# Patient Record
Sex: Male | Born: 1935 | Race: White | Hispanic: No | Marital: Single | Smoking: Former smoker
Health system: Southern US, Community
[De-identification: ages and names within clinical notes are randomized; demographics above are authoritative.]

## PROBLEM LIST (undated history)

## (undated) DIAGNOSIS — Z66 Do not resuscitate: Secondary | ICD-10-CM

## (undated) DIAGNOSIS — Z85828 Personal history of other malignant neoplasm of skin: Secondary | ICD-10-CM

## (undated) DIAGNOSIS — I255 Ischemic cardiomyopathy: Secondary | ICD-10-CM

## (undated) DIAGNOSIS — Z8719 Personal history of other diseases of the digestive system: Secondary | ICD-10-CM

## (undated) DIAGNOSIS — E119 Type 2 diabetes mellitus without complications: Secondary | ICD-10-CM

## (undated) DIAGNOSIS — I951 Orthostatic hypotension: Secondary | ICD-10-CM

## (undated) DIAGNOSIS — N179 Acute kidney failure, unspecified: Secondary | ICD-10-CM

## (undated) DIAGNOSIS — Z8711 Personal history of peptic ulcer disease: Secondary | ICD-10-CM

## (undated) DIAGNOSIS — I10 Essential (primary) hypertension: Secondary | ICD-10-CM

## (undated) DIAGNOSIS — I350 Nonrheumatic aortic (valve) stenosis: Secondary | ICD-10-CM

## (undated) DIAGNOSIS — I214 Non-ST elevation (NSTEMI) myocardial infarction: Secondary | ICD-10-CM

## (undated) DIAGNOSIS — D649 Anemia, unspecified: Secondary | ICD-10-CM

## (undated) DIAGNOSIS — I2510 Atherosclerotic heart disease of native coronary artery without angina pectoris: Secondary | ICD-10-CM

## (undated) DIAGNOSIS — I4821 Permanent atrial fibrillation: Secondary | ICD-10-CM

## (undated) HISTORY — DX: Type 2 diabetes mellitus without complications: E11.9

## (undated) HISTORY — DX: Personal history of other diseases of the digestive system: Z87.19

## (undated) HISTORY — DX: Non-ST elevation (NSTEMI) myocardial infarction: I21.4

## (undated) HISTORY — DX: Essential (primary) hypertension: I10

## (undated) HISTORY — DX: Personal history of other malignant neoplasm of skin: Z85.828

## (undated) HISTORY — DX: Atherosclerotic heart disease of native coronary artery without angina pectoris: I25.10

## (undated) HISTORY — DX: Permanent atrial fibrillation: I48.21

## (undated) HISTORY — DX: Ischemic cardiomyopathy: I25.5

## (undated) HISTORY — PX: ABDOMINAL SURGERY: SHX537

## (undated) HISTORY — DX: Personal history of peptic ulcer disease: Z87.11

## (undated) HISTORY — DX: Nonrheumatic aortic (valve) stenosis: I35.0

---

## 1997-08-17 ENCOUNTER — Encounter: Admission: RE | Admit: 1997-08-17 | Discharge: 1997-11-15 | Payer: Self-pay | Admitting: Internal Medicine

## 2000-07-10 ENCOUNTER — Ambulatory Visit (HOSPITAL_COMMUNITY): Admission: RE | Admit: 2000-07-10 | Discharge: 2000-07-10 | Payer: Self-pay | Admitting: Ophthalmology

## 2004-03-28 ENCOUNTER — Ambulatory Visit (HOSPITAL_COMMUNITY): Admission: RE | Admit: 2004-03-28 | Discharge: 2004-03-28 | Payer: Self-pay | Admitting: *Deleted

## 2008-12-23 ENCOUNTER — Ambulatory Visit (HOSPITAL_COMMUNITY): Admission: RE | Admit: 2008-12-23 | Discharge: 2008-12-23 | Payer: Self-pay | Admitting: General Surgery

## 2008-12-23 ENCOUNTER — Encounter (INDEPENDENT_AMBULATORY_CARE_PROVIDER_SITE_OTHER): Payer: Self-pay | Admitting: General Surgery

## 2009-03-11 ENCOUNTER — Observation Stay (HOSPITAL_COMMUNITY): Admission: EM | Admit: 2009-03-11 | Discharge: 2009-03-11 | Payer: Self-pay | Admitting: Emergency Medicine

## 2009-11-07 ENCOUNTER — Ambulatory Visit: Payer: Self-pay | Admitting: Cardiology

## 2010-03-06 HISTORY — PX: COLONOSCOPY: SHX174

## 2010-05-22 LAB — CBC
HCT: 39.5 % (ref 39.0–52.0)
Hemoglobin: 13.6 g/dL (ref 13.0–17.0)
MCV: 89.9 fL (ref 78.0–100.0)
Platelets: 245 10*3/uL (ref 150–400)
RDW: 14 % (ref 11.5–15.5)

## 2010-05-22 LAB — COMPREHENSIVE METABOLIC PANEL
AST: 22 U/L (ref 0–37)
Albumin: 4.1 g/dL (ref 3.5–5.2)
Alkaline Phosphatase: 55 U/L (ref 39–117)
CO2: 21 mEq/L (ref 19–32)
Chloride: 101 mEq/L (ref 96–112)
Creatinine, Ser: 1.15 mg/dL (ref 0.4–1.5)
GFR calc Af Amer: >60 mL/min (ref >60)
GFR calc non Af Amer: >60 mL/min (ref >60)
Potassium: 4.1 mEq/L (ref 3.5–5.1)
Total Bilirubin: 0.3 mg/dL (ref 0.3–1.2)

## 2010-05-22 LAB — CK TOTAL AND CKMB (NOT AT ARMC)
CK, MB: 1.2 ng/mL (ref 0.3–4.0)
CK, MB: 1.6 ng/mL (ref 0.3–4.0)
Relative Index: INVALID (ref 0.0–2.5)
Total CK: 70 U/L (ref 7–232)

## 2010-05-22 LAB — RAPID URINE DRUG SCREEN, HOSP PERFORMED
Barbiturates: NOT DETECTED
Opiates: NOT DETECTED

## 2010-05-22 LAB — TROPONIN I: Troponin I: 0.06 ng/mL (ref 0.00–0.06)

## 2010-06-09 LAB — GLUCOSE, CAPILLARY

## 2010-06-09 LAB — BASIC METABOLIC PANEL
BUN: 23 mg/dL (ref 6–23)
CO2: 29 mEq/L (ref 19–32)
Chloride: 101 mEq/L (ref 96–112)
GFR calc non Af Amer: 57 mL/min — ABNORMAL LOW (ref >60)
Glucose, Bld: 154 mg/dL — ABNORMAL HIGH (ref 70–99)
Potassium: 4 mEq/L (ref 3.5–5.1)
Sodium: 137 mEq/L (ref 135–145)

## 2010-10-08 ENCOUNTER — Inpatient Hospital Stay (HOSPITAL_COMMUNITY)
Admission: EM | Admit: 2010-10-08 | Discharge: 2010-10-17 | DRG: 377 | Disposition: A | Discharge status: Home Health | Payer: Medicare Other | Source: Other Acute Inpatient Hospital | Attending: Internal Medicine | Admitting: Internal Medicine

## 2010-10-08 DIAGNOSIS — K5731 Diverticulosis of large intestine without perforation or abscess with bleeding: Principal | ICD-10-CM | POA: Diagnosis present

## 2010-10-08 DIAGNOSIS — I4891 Unspecified atrial fibrillation: Secondary | ICD-10-CM | POA: Diagnosis present

## 2010-10-08 DIAGNOSIS — I2510 Atherosclerotic heart disease of native coronary artery without angina pectoris: Secondary | ICD-10-CM | POA: Diagnosis present

## 2010-10-08 DIAGNOSIS — D62 Acute posthemorrhagic anemia: Secondary | ICD-10-CM | POA: Diagnosis present

## 2010-10-08 DIAGNOSIS — E119 Type 2 diabetes mellitus without complications: Secondary | ICD-10-CM | POA: Diagnosis present

## 2010-10-08 DIAGNOSIS — I252 Old myocardial infarction: Secondary | ICD-10-CM

## 2010-10-08 DIAGNOSIS — I10 Essential (primary) hypertension: Secondary | ICD-10-CM | POA: Diagnosis present

## 2010-10-08 DIAGNOSIS — J189 Pneumonia, unspecified organism: Secondary | ICD-10-CM | POA: Diagnosis present

## 2010-10-08 DIAGNOSIS — R404 Transient alteration of awareness: Secondary | ICD-10-CM | POA: Diagnosis not present

## 2010-10-08 DIAGNOSIS — I214 Non-ST elevation (NSTEMI) myocardial infarction: Secondary | ICD-10-CM | POA: Diagnosis present

## 2010-10-08 DIAGNOSIS — E538 Deficiency of other specified B group vitamins: Secondary | ICD-10-CM | POA: Diagnosis present

## 2010-10-08 LAB — HEMOGLOBIN AND HEMATOCRIT, BLOOD
HCT: 26.1 % — ABNORMAL LOW (ref 39.0–52.0)
Hemoglobin: 9.1 g/dL — ABNORMAL LOW (ref 13.0–17.0)

## 2010-10-08 LAB — BASIC METABOLIC PANEL
Calcium: 7.6 mg/dL — ABNORMAL LOW (ref 8.4–10.5)
GFR calc Af Amer: >60 mL/min (ref >60)
GFR calc non Af Amer: >60 mL/min (ref >60)
Glucose, Bld: 225 mg/dL — ABNORMAL HIGH (ref 70–99)
Sodium: 138 mEq/L (ref 135–145)

## 2010-10-08 LAB — GLUCOSE, CAPILLARY: Glucose-Capillary: 189 mg/dL — ABNORMAL HIGH (ref 70–99)

## 2010-10-08 LAB — CBC
MCH: 31.1 pg (ref 26.0–34.0)
Platelets: 179 10*3/uL (ref 150–400)
RBC: 2.93 MIL/uL — ABNORMAL LOW (ref 4.22–5.81)
WBC: 9.8 10*3/uL (ref 4.0–10.5)

## 2010-10-08 LAB — PROTIME-INR
INR: 1.16 (ref 0.00–1.49)
Prothrombin Time: 15 seconds (ref 11.6–15.2)

## 2010-10-09 ENCOUNTER — Inpatient Hospital Stay (HOSPITAL_COMMUNITY): Payer: Medicare Other

## 2010-10-09 LAB — CARDIAC PANEL(CRET KIN+CKTOT+MB+TROPI)
CK, MB: 6.1 ng/mL (ref 0.3–4.0)
Relative Index: 5.7 — ABNORMAL HIGH (ref 0.0–2.5)
Total CK: 410 U/L — ABNORMAL HIGH (ref 7–232)
Troponin I: 0.81 ng/mL (ref <0.30)
Troponin I: 4.68 ng/mL (ref <0.30)
Troponin I: 10.46 ng/mL (ref <0.30)

## 2010-10-09 LAB — GLUCOSE, CAPILLARY: Glucose-Capillary: 205 mg/dL — ABNORMAL HIGH (ref 70–99)

## 2010-10-09 LAB — ABO/RH: ABO/RH(D): A POS

## 2010-10-09 LAB — HEMOGLOBIN AND HEMATOCRIT, BLOOD
HCT: 23.8 % — ABNORMAL LOW (ref 39.0–52.0)
HCT: 24.9 % — ABNORMAL LOW (ref 39.0–52.0)

## 2010-10-10 ENCOUNTER — Inpatient Hospital Stay (HOSPITAL_COMMUNITY): Payer: Medicare Other

## 2010-10-10 LAB — HEMOGLOBIN AND HEMATOCRIT, BLOOD
HCT: 26.9 % — ABNORMAL LOW (ref 39.0–52.0)
HCT: 27.2 % — ABNORMAL LOW (ref 39.0–52.0)
Hemoglobin: 9.2 g/dL — ABNORMAL LOW (ref 13.0–17.0)
Hemoglobin: 9.6 g/dL — ABNORMAL LOW (ref 13.0–17.0)

## 2010-10-10 LAB — BASIC METABOLIC PANEL
BUN: 13 mg/dL (ref 6–23)
CO2: 27 mEq/L (ref 19–32)
Chloride: 102 mEq/L (ref 96–112)
Glucose, Bld: 202 mg/dL — ABNORMAL HIGH (ref 70–99)
Potassium: 3.7 mEq/L (ref 3.5–5.1)
Sodium: 136 mEq/L (ref 135–145)

## 2010-10-10 LAB — GLUCOSE, CAPILLARY
Glucose-Capillary: 206 mg/dL — ABNORMAL HIGH (ref 70–99)
Glucose-Capillary: 238 mg/dL — ABNORMAL HIGH (ref 70–99)

## 2010-10-10 LAB — HEMOGLOBIN A1C
Hgb A1c MFr Bld: 6.6 % — ABNORMAL HIGH (ref <5.7)
Mean Plasma Glucose: 143 mg/dL — ABNORMAL HIGH (ref <117)

## 2010-10-10 MED ORDER — TECHNETIUM TC 99M-LABELED RED BLOOD CELLS IV KIT
25.0000 | PACK | Freq: Once | INTRAVENOUS | Status: AC | PRN
  Administered 2010-10-10: 25 via INTRAVENOUS

## 2010-10-11 LAB — CBC
Hemoglobin: 9.5 g/dL — ABNORMAL LOW (ref 13.0–17.0)
MCH: 30.6 pg (ref 26.0–34.0)
MCV: 89.7 fL (ref 78.0–100.0)
RBC: 3.1 MIL/uL — ABNORMAL LOW (ref 4.22–5.81)
WBC: 8.8 10*3/uL (ref 4.0–10.5)

## 2010-10-11 LAB — HEMOGLOBIN AND HEMATOCRIT, BLOOD
HCT: 27.4 % — ABNORMAL LOW (ref 39.0–52.0)
Hemoglobin: 9.2 g/dL — ABNORMAL LOW (ref 13.0–17.0)

## 2010-10-11 LAB — GLUCOSE, CAPILLARY

## 2010-10-11 LAB — BASIC METABOLIC PANEL
BUN: 9 mg/dL (ref 6–23)
CO2: 30 mEq/L (ref 19–32)
Chloride: 102 mEq/L (ref 96–112)
Glucose, Bld: 208 mg/dL — ABNORMAL HIGH (ref 70–99)
Potassium: 3.8 mEq/L (ref 3.5–5.1)

## 2010-10-11 NOTE — H&P (Signed)
NAMEJACARIUS, [name] NO.:  [id number]  MEDICAL RECORD NO.:  [id number]  LOCATION:  2103                         FACILITY:  [facility]  PHYSICIAN:  Tarry Kos, MD       DATE OF BIRTH:  [birth year]  DATE OF ADMISSION:  10/08/2010 DATE OF DISCHARGE:                             HISTORY & PHYSICAL   CHIEF COMPLAINT:  Bright red blood per rectum, transferred here from [name].  HISTORY OF PRESENT ILLNESS:  [name] is a 77 year old male with a history of diverticular bleeds in the past and non-STEMI in the last year who refused intervention at that time who has been medically managed only, who presented to the [name] on October 06, 2010, with a GI bleed.  He has received 5 units of blood total there.  He was hypotensive this morning with systolics around 80.  His current systolics are above 100.  He had a bowel prep last night and had a colonoscopy by Dr. Conard Novak, which showed severe diverticulosis throughout the colon with a bleeding diverticulum of the proximal transverse colon, treated with 2 hemoclips.  They subsequently tried to get a Surgery consult, however, he refused to see that surgeon that was on-call there as they only have 1 surgeon available, so at that time, he was transferred here in case he needed surgical intervention because he was refusing to see the surgeon at [name].  The patient states that he has not had any further bleeding at all since last night.  He denies any pain.  He denies any nausea or vomiting.  He is a difficult historian. He says that he initially said he was taking aspirin and Plavix at home, however, then he said he only takes aspirin, however, neither the one of these on his home medication list per his H and P, but again he does say he takes aspirin, but I am not really sure how reliable he is.  He denies any fever, no chills.  REVIEW OF SYSTEMS:  Negative.  HOME MEDICATIONS:  Glipizide, lisinopril, Imdur, metformin,  Novolin 70/30, atenolol, chlorthalidone.  ALLERGIES:  None.  PAST MEDICAL HISTORY:  Diabetes, hypertension.  He had a non-STEMI in September 2011.  At that time, he refused stress testing and so he did not have a heart catheterization.  Ectopic atrial rhythm, status post abdominal surgery in 1994, presumed from gastric ulcer surgery, skin cancer removed from the left ear.  SOCIAL HISTORY:  Denies smoking or alcohol.  No IV drug abuse.  PHYSICAL EXAMINATION:  CURRENT VITAL SIGNS:  Temperature is 98.5, blood pressure systolics are over 100, heart rate is 95-108, O2 sats 99% on room air, respiratory rate normal. GENERAL:  He is alert and oriented x4, in no apparent stress, cooperating friendly. HEENT:  Extraocular muscles intact.  Pupils equal and reactive to light. Oropharynx clear.  Mucous membranes moist. NECK:  No JVD.  No carotid bruits. CORONARY:  Regular rate and rhythm without murmurs or gallops. CHEST:  Clear to auscultation bilaterally.  No rhonchi, wheeze, or rales. ABDOMEN:  Soft, nontender, nondistended.  Positive bowel sounds.  No hepatosplenomegaly. EXTREMITIES:  No clubbing, cyanosis, or edema. PSYCHIATRIC:  Normal affect. NEUROLOGIC:  No focal neurologic deficits.  LABS:  Reviewed from a [name].  His hemoglobin yesterday was 7.9 with a MCV of 89.8, platelet counts were normal.  He had a repeat hemoglobin today at 9:35 a.m. and it was 9.8.  His BUN and creatinine this morning was 35 and 1.42 with a normal sodium, potassium is 4.2.  His coags were normal at [name].  ASSESSMENT AND PLAN:  This is a 77 year old male with diverticular bleed, acute blood loss anemia.  1. Lower gastrointestinal bleed secondary to diverticular bleed with     acute blood loss anemia.  He is status post 5 units of packed red     blood cells.  I will send off for a stat hemoglobin right now to     check his H and H and provide further support if needed, however,     he currently  seems to have stopped bleeding.  If his bleeding     recurs or he become hemodynamically unstable, we will proceed with     surgical consult.  We will call [name] on-call who is Dr. Dulce Sellar     who was already called by [name] who is aware of the patient     coming here.  We will let him know the patient has __________.  We     will proceed with GI consultation and we will hold off on surgery     involvement unless he rebleeds. 2. History of a non-ST segment elevation myocardial infarction with in     complete workup due to patient's refusal.  I am assuming he has     been on aspirin, so obviously hold all aspirin products, this will     need to be resumed in 1-2 weeks, however, he is certainly high risk     for further gastrointestinal bleed issues. 3. Presumptive coronary artery disease, again as above. 4. Diabetes.  He is n.p.o. right now.  We will watch his sugars     closely.  Provide sliding scale insulin as needed. 5. The patient is full code.  Further recommendation depending on     overall [name] course.          ______________________________ Tarry Kos, MD     RD/MEDQ  D:  10/08/2010  T:  10/08/2010  Job:  [id number]  Electronically Signed by Tarry Kos MD on 10/11/2010 09:58:40 PM

## 2010-10-12 LAB — HEMOGLOBIN AND HEMATOCRIT, BLOOD
HCT: 23.5 % — ABNORMAL LOW (ref 39.0–52.0)
HCT: 23.2 % — ABNORMAL LOW (ref 39.0–52.0)
Hemoglobin: 8.3 g/dL — ABNORMAL LOW (ref 13.0–17.0)

## 2010-10-12 LAB — GLUCOSE, CAPILLARY: Glucose-Capillary: 250 mg/dL — ABNORMAL HIGH (ref 70–99)

## 2010-10-13 LAB — VITAMIN B12: Vitamin B-12: 85 pg/mL — ABNORMAL LOW (ref 211–911)

## 2010-10-13 LAB — CROSSMATCH
Unit division: 0
Unit division: 0

## 2010-10-13 LAB — GLUCOSE, CAPILLARY
Glucose-Capillary: 279 mg/dL — ABNORMAL HIGH (ref 70–99)
Glucose-Capillary: 292 mg/dL — ABNORMAL HIGH (ref 70–99)

## 2010-10-13 LAB — CBC
MCH: 30.5 pg (ref 26.0–34.0)
MCHC: 34.1 g/dL (ref 30.0–36.0)
RBC: 2.36 MIL/uL — ABNORMAL LOW (ref 4.22–5.81)
RDW: 14.5 % (ref 11.5–15.5)
WBC: 6.6 10*3/uL (ref 4.0–10.5)

## 2010-10-13 LAB — FOLATE: Folate: 18.1 ng/mL

## 2010-10-13 LAB — BASIC METABOLIC PANEL
BUN: 15 mg/dL (ref 6–23)
Calcium: 9.4 mg/dL (ref 8.4–10.5)
GFR calc Af Amer: >60 mL/min (ref >60)
GFR calc non Af Amer: >60 mL/min (ref >60)
Glucose, Bld: 220 mg/dL — ABNORMAL HIGH (ref 70–99)
Potassium: 4 mEq/L (ref 3.5–5.1)

## 2010-10-13 LAB — HEMOGLOBIN AND HEMATOCRIT, BLOOD
HCT: 23.3 % — ABNORMAL LOW (ref 39.0–52.0)
HCT: 26.3 % — ABNORMAL LOW (ref 39.0–52.0)
Hemoglobin: 7.8 g/dL — ABNORMAL LOW (ref 13.0–17.0)
Hemoglobin: 9 g/dL — ABNORMAL LOW (ref 13.0–17.0)

## 2010-10-13 LAB — FERRITIN: Ferritin: 54 ng/mL (ref 22–322)

## 2010-10-14 LAB — BASIC METABOLIC PANEL
BUN: 13 mg/dL (ref 6–23)
GFR calc Af Amer: >60 mL/min (ref >60)
GFR calc non Af Amer: >60 mL/min (ref >60)
Potassium: 4 mEq/L (ref 3.5–5.1)
Sodium: 136 mEq/L (ref 135–145)

## 2010-10-14 LAB — CROSSMATCH: ABO/RH(D): A POS

## 2010-10-14 LAB — HEMOGLOBIN AND HEMATOCRIT, BLOOD
HCT: 26 % — ABNORMAL LOW (ref 39.0–52.0)
HCT: 26.2 % — ABNORMAL LOW (ref 39.0–52.0)
Hemoglobin: 8.8 g/dL — ABNORMAL LOW (ref 13.0–17.0)

## 2010-10-14 LAB — CBC
HCT: 26.2 % — ABNORMAL LOW (ref 39.0–52.0)
MCHC: 32.8 g/dL (ref 30.0–36.0)
Platelets: 253 10*3/uL (ref 150–400)
RDW: 15.2 % (ref 11.5–15.5)

## 2010-10-14 LAB — GLUCOSE, CAPILLARY: Glucose-Capillary: 287 mg/dL — ABNORMAL HIGH (ref 70–99)

## 2010-10-14 NOTE — Consult Note (Signed)
NAMEGARLAN, DREWES NO.:  [id number]  MEDICAL RECORD NO.:  [id number]  LOCATION:  2103                         FACILITY:  [facility]  PHYSICIAN:  Willis Modena, MD     DATE OF BIRTH:  [birth year]  DATE OF CONSULTATION:  10/09/2010 DATE OF DISCHARGE:                                CONSULTATION   REASON FOR CONSULTATION:  Hematochezia.  CHIEF COMPLAINT:  Weakness.  HISTORY OF PRESENT ILLNESS:  Mr. Heffern is a 77 year old gentleman who presented to [facility] with complaints of GI bleed.  This was described as some bright red blood per rectum and had been ongoing for a few days.  Once he arrived at [facility], he continued to bleed and ultimately underwent a colonoscopy.  This showed a pan diverticulosis with a bleeding diverticula noted in the proximal transverse colon that was treated with hemoclips.  Apparently, he improved but still had some further bleeding and Surgery at that facility was consulted.  The patient was reluctant to pursue surgical therapy and requested to be transferred here for other less invasive options.  Since his arrival last night, Mr. Sunday has had no further bleeding. He has had some troubles with tachycardia and had a non-ST-elevation MI. He has no abdominal pain, nausea, vomiting.  He is hungry.  He does describe having had several other similar episodes of bleeding like this in the past.  Past medical history, past surgical history, home medications, allergies, family history, social history, review of systems as from history and physical from Dr. Onalee Hua dictated October 08, 2010, are reviewed and I agree.  PHYSICAL EXAMINATION:  VITAL SIGNS:  Blood pressure is about 110 systolic, heart rate is about 562, respiratory rate 15, oxygen saturation over 95%. GENERAL:  Mr. Woodcox is nontoxic-appearing, he is a little pale. EYES:  Sclerae anicteric.  Conjunctivae pale. NECK:  Thick but supple. HEART:  Irregularly irregular,  tachycardic. LUNGS:  Clear. ABDOMEN:  Soft, nontender, nondistended.  Normoactive bowel sounds.  No liver or splenic enlargement.  No bulging flanks to suggest ascites. EXTREMITIES:  No peripheral cyanosis, clubbing, or edema. PSYCHIATRIC:  Normal mood and affect. NEUROLOGIC:  Diffusely nonfocal without lateralizing signs. LYMPHATICS:  No palpable axillary, submandibular, or supraclavicular adenopathy.  LABORATORY STUDIES:  Hemoglobin on arrival 9.0, currently 8.8, white count 9.8, platelet count 179, INR 1.16.  Sodium 138, potassium 4.4, chloride 104, bicarb 26, BUN 27, creatinine 1.01.  Cardiac enzymes are noted and are elevated.  Chest x-ray done shows some patchy left lower lobe airspace disease, nonspecific.  It does note that he has had attempted angioembolization in the past for similar bleeding in January 2006.  IMPRESSION:  Mr. Hardgrove is a 77 year old gentleman presenting with symptoms highly typical of a diverticular bleed.  Fortunately, diverticula being complicated by non-ST-elevation myocardial infarction. Fortunately, his bleeding appears to have stopped.  He otherwise is stable from a gastrointestinal standpoint.  PLAN: 1. Agree with supportive management with IV fluids and serial CBCs. 2. Treat his atrial fibrillation with rapid ventricular response per     [facility].  This should improve hopefully with volume     repletion. 3. Would follow expectantly.  If he re-bleeds,  one will need to     discuss the case with Interventional Radiology.  It is not clear     whether there will be much utility in a tagged red blood cell scan     since a source was found in the proximal transverse colon. 4. We will initiate sips of clear liquids. 5. We will follow along with you.  Thanks again for allowing me to participate in Mr. Haywood care.     Willis Modena, MD     WO/MEDQ  D:  10/09/2010  T:  10/09/2010  Job:  [id number]  Electronically Signed by Willis Modena  on 10/14/2010 07:25:36 PM

## 2010-10-15 LAB — CBC
MCH: 29.7 pg (ref 26.0–34.0)
MCHC: 33.5 g/dL (ref 30.0–36.0)
Platelets: 277 10*3/uL (ref 150–400)
RBC: 3.1 MIL/uL — ABNORMAL LOW (ref 4.22–5.81)

## 2010-10-15 LAB — GLUCOSE, CAPILLARY: Glucose-Capillary: 181 mg/dL — ABNORMAL HIGH (ref 70–99)

## 2010-10-16 LAB — COMPREHENSIVE METABOLIC PANEL
Albumin: 2.8 g/dL — ABNORMAL LOW (ref 3.5–5.2)
BUN: 15 mg/dL (ref 6–23)
Creatinine, Ser: 1.05 mg/dL (ref 0.50–1.35)
Total Protein: 6 g/dL (ref 6.0–8.3)

## 2010-10-16 LAB — CBC
HCT: 28 % — ABNORMAL LOW (ref 39.0–52.0)
MCHC: 33.6 g/dL (ref 30.0–36.0)
MCV: 88.9 fL (ref 78.0–100.0)
RDW: 14.5 % (ref 11.5–15.5)

## 2010-10-16 LAB — GLUCOSE, CAPILLARY
Glucose-Capillary: 324 mg/dL — ABNORMAL HIGH (ref 70–99)
Glucose-Capillary: 173 mg/dL — ABNORMAL HIGH (ref 70–99)

## 2010-10-17 LAB — CBC
MCV: 89.2 fL (ref 78.0–100.0)
Platelets: 299 10*3/uL (ref 150–400)
RDW: 14.4 % (ref 11.5–15.5)
WBC: 8.4 10*3/uL (ref 4.0–10.5)

## 2010-10-17 LAB — GLUCOSE, CAPILLARY: Glucose-Capillary: 191 mg/dL — ABNORMAL HIGH (ref 70–99)

## 2010-10-31 NOTE — Consult Note (Signed)
[name]  KEYSHUN, ELPERS NO.:  [id number]  MEDICAL RECORD NO.:  [id number]  LOCATION:  2103                         FACILITY:  [facility]  PHYSICIAN:  Georga Hacking, M.D.DATE OF BIRTH:  Jan 12, 1931  DATE OF CONSULTATION:  10/09/2010                                CONSULTATION   I was asked to see this 77 year old male for evaluation of rapid atrial fibrillation.  The patient has a history of hypertension and diabetes. He evidently was hospitalized in the [facility] with a non-ST-elevation MI in September 2011.  They refused cardiac workup at that time because he states he does want to have any work done on his heart.  He presented to [facility] with a lower gastrointestinal bleed and was transferred here.  He has been transfused a total of 5 units of packed cells now.  He was found to be in rapid atrial fibrillation.  The age of onset of this is really undetermined at this time.  He was in atrial fibrillation on transfer yesterday.  Age of onset of this cannot be determined.  He does have episodic palpitations.  He is not currently complaining of chest pain or shortness of breath.  He was given diltiazem, continues to have rapid atrial fibrillation with rates in the 120s-130s.  His EKG is remarkable for marked ST-segment depression and rapid atrial fibrillation.  His total CPK was 313, MB was 14.9 and his MB was 4.68. He is currently not complaining of any chest discomfort or shortness of breath.  He has not had any recent active GI bleeding.  He denies prior history of angina and has normally no PND, orthopnea, or edema.  He states he normally has exercise tolerance that is normal.  PAST SURGICAL HISTORY:  Hypertension and diabetes mellitus previously.  PAST SURGICAL HISTORY:  Abdominal surgery for unknown reasons previously.  ALLERGIES:  None.  CURRENT MEDICATIONS:  He is currently on a diltiazem drip.  He also is on other medicines per the chart.  SOCIAL HISTORY:  He  currently lives alone.  He states that he has stepchildren.  He is retired, working at a school.  He quit smoking cigarettes around 4 years ago.  Does use alcohol to excess.  FAMILY HISTORY:  Largely unknown.  One parent died in their 45s and other in the their 51s of unknown causes.  He states he has several brothers and sisters, but has never bothered to count the number.  REVIEW OF SYSTEMS:  Weight has been stable.  He denies PND, orthopnea, or edema normally.  Does have GI bleeding previously.  Previous history of non-ST-elevation MI.  No recent syncope.  Mild nocturia, mild arthritis.  Other than as noted above, the remainder of review of systems is unremarkable.  PHYSICAL EXAMINATION:  GENERAL:  He is a pleasant, mildly obese white male, wanting to eat. VITAL SIGNS:  Blood pressure currently 130/70.  Pulse is currently 120 and irregularly regular. SKIN:  Warm and dry. ENT:  EOMI.  PERRLA.  C and S clear.  Fundi not examined.  Pharynx negative. NECK:  Supple without masses.  There are no carotid bruits. LUNGS:  Clear bilaterally. CARDIOVASCULAR:  Rapid irregular rhythm.  Normal S1 and S2.  No S3.  No murmur. ABDOMEN:  Soft.  Pulses are 1+ distally.  No edema is noted.  A 12-lead EKG shows atrial fibrillation with rapid response with marked ST depression across the anterolateral precordial leads.  There is at least 3-4 mm of ST-segment depression noted.  LABORATORY DATA:  Hemoglobin of 9, it dropped to 8.4 today.  BUN is 27, creatinine is 1.  IMPRESSION: 1. Rapid atrial fibrillation. 2. Non-ST-elevation myocardial infarction, initial episode, likely due     to demand ischemia.  He certainly has multiple risk factors for     coronary artery disease. 3. Hypertension. 4. Diabetes mellitus. 5. Acute lower gastrointestinal bleed.  RECOMMENDATIONS:  The patient has rapid atrial fibrillation with ST depression.  He is currently uncontrolled.  My recommendations would be to  try to use beta-blockade because of ST-elevation MI to lower his heart rate and reduce his myocardial oxygen demands.  He is obviously not a candidate for any form of anticoagulation including aspirin or heparin due to his acute GI bleeding.  He previously has not been amenable to cardiac workup and would try to stabilize him.  If his rate does not come under control with beta-blocker and IV Cardizem, then I would consider adding amiodarone to his regimen.  Thank you for asking me to see him with you.     Georga Hacking, M.D.     WST/MEDQ  D:  10/09/2010  T:  10/09/2010  Job:  [id number]  cc:   Kirstie Peri, MD  Electronically Signed by Lacretia Nicks. Donnie Aho M.D. on 10/31/2010 09:22:34 AM

## 2010-11-16 DIAGNOSIS — I4891 Unspecified atrial fibrillation: Secondary | ICD-10-CM

## 2010-11-23 ENCOUNTER — Emergency Department (HOSPITAL_COMMUNITY)
Admission: EM | Admit: 2010-11-23 | Discharge: 2010-11-23 | Disposition: A | Discharge status: Home / Self Care | Payer: Medicare Other | Attending: Emergency Medicine | Admitting: Emergency Medicine

## 2010-11-23 ENCOUNTER — Encounter (HOSPITAL_COMMUNITY): Payer: Self-pay | Admitting: Radiology

## 2010-11-23 ENCOUNTER — Emergency Department (HOSPITAL_COMMUNITY): Payer: Medicare Other

## 2010-11-23 DIAGNOSIS — K5900 Constipation, unspecified: Secondary | ICD-10-CM | POA: Insufficient documentation

## 2010-11-23 DIAGNOSIS — I10 Essential (primary) hypertension: Secondary | ICD-10-CM | POA: Insufficient documentation

## 2010-11-23 DIAGNOSIS — E119 Type 2 diabetes mellitus without complications: Secondary | ICD-10-CM | POA: Insufficient documentation

## 2010-11-23 DIAGNOSIS — I4891 Unspecified atrial fibrillation: Secondary | ICD-10-CM | POA: Insufficient documentation

## 2010-11-23 DIAGNOSIS — I214 Non-ST elevation (NSTEMI) myocardial infarction: Secondary | ICD-10-CM | POA: Insufficient documentation

## 2010-11-23 LAB — COMPREHENSIVE METABOLIC PANEL
ALT: 8 U/L (ref 0–53)
AST: 11 U/L (ref 0–37)
Albumin: 3.4 g/dL — ABNORMAL LOW (ref 3.5–5.2)
Alkaline Phosphatase: 63 U/L (ref 39–117)
BUN: 12 mg/dL (ref 6–23)
Chloride: 100 mEq/L (ref 96–112)
Potassium: 4.3 mEq/L (ref 3.5–5.1)
Total Bilirubin: 0.3 mg/dL (ref 0.3–1.2)

## 2010-11-23 LAB — DIFFERENTIAL
Eosinophils Relative: 2 % (ref 0–5)
Lymphocytes Relative: 22 % (ref 12–46)
Lymphs Abs: 1.6 10*3/uL (ref 0.7–4.0)
Monocytes Absolute: 0.6 10*3/uL (ref 0.1–1.0)
Monocytes Relative: 8 % (ref 3–12)

## 2010-11-23 LAB — LACTIC ACID, PLASMA: Lactic Acid, Venous: 1.6 mmol/L (ref 0.5–2.2)

## 2010-11-23 LAB — URINALYSIS, ROUTINE W REFLEX MICROSCOPIC
Glucose, UA: NEGATIVE mg/dL
Ketones, ur: NEGATIVE mg/dL
Leukocytes, UA: NEGATIVE
pH: 6 (ref 5.0–8.0)

## 2010-11-23 LAB — CBC
HCT: 32.4 % — ABNORMAL LOW (ref 39.0–52.0)
MCV: 81.6 fL (ref 78.0–100.0)
RDW: 15 % (ref 11.5–15.5)
WBC: 7.6 10*3/uL (ref 4.0–10.5)

## 2010-12-01 NOTE — Discharge Summary (Signed)
[name], PILE NO.:  [id number]  MEDICAL RECORD NO.:  [id number]  LOCATION:  3737                         FACILITY:  [facility]  PHYSICIAN:  Lonia Blood, M.D.DATE OF BIRTH:  11-12-1930  DATE OF ADMISSION:  10/08/2010 DATE OF DISCHARGE:  10/17/2010                        DISCHARGE SUMMARY - REFERRING   DISCHARGE DIAGNOSES: 1. Diverticular lower gastrointestinal bleed.     a.     Significant anemia at the time of presentation.     b.     Spontaneously resolved.     c.     Prior treatment at [facility] before transfer to      [facility]. 2. Acute blood loss anemia.     a.     Secondary to gastrointestinal bleed.     b.     Status post 2 units of packed red blood cell transfusion at      [facility].     c.     Hemoglobin 9.6 at time of discharge, which is in upward      trend. 3. Acute delirium.     a.     Possible mild dementia at baseline.     b.     Felt to be severely exacerbated by markedly low B12.     c.     Much improved with aggressive B12 replacement. 4. Profound B12 deficiency.     a.     The patient's B12 level 80 during hospital stay.     b.     B12 given at 1000 mcg daily for an approximate 5-day load      while in hospital.     c.     Significant improvement in mental status with B12      replacement.     d.     We will need ongoing outpatient B12 replacement as noted      below.     e.     Not felt to be able to absorb oral B12/pernicious anemia. 5. Hypertension. 6. Atrial fibrillation -- paroxysmal -- not a candidate for     anticoagulation secondary to bleeding. 7. Diabetes mellitus. 8. Non-ST-segment elevation myocardial infarction/demand ischemia --     medical treatment only.  DISCHARGE MEDICATIONS:  A complete list of the patient's discharge medications is available as per the discharge med manager portion of the patient's e-Chart computer system file.  Of note, the patient's lisinopril has been  cut to 20 mg a day.  He is to continue his atenolol/chlorthalidone combination as previously taken.  No changes were made in his glipizide, Imdur, multivitamin, metformin or 70/30 insulin regimen.  The patient has been instructed do not take aspirin until further cleared by his primary care physician.  It is recommended that he abstain from aspirin for at least 2 weeks.  Additionally, the patient will need chronic B12 injections.  It is suggested that he receive 1000 mcg subcutaneously q. 2 weeks x2 injections and then transition to 1000 mcg subcu q. month from their own.  This has been discussed with his power of attorney as well as with the patient.  This information is to be  faxed to his primary care physician's office.  CONSULTATIONS: 1. Gastroenterology. 2. Cardiology.  PROCEDURES: 1. Nuclear medicine tagged red blood cells.  GI blood loss study on     October 10, 2010 -- no abnormal radiotracer uptake worrisome for     active GI bleed. 2. Transthoracic echocardiogram on October 09, 2010 -- LV cavity size is     normal.  Wall thickness increased in the pattern consistent with     left ventricular hypertrophy.  Systolic function normal.  EF 45% to     55%.  No severe appreciated valvular abnormalities.  FOLLOWUP:  The patient has been instructed to follow up with his primary care physician, Dr. Sherryll Burger, at the office in [place].  He is instructed that he should follow up within a 5 to 7 days.  This has also been discussed with his power of attorney, Amy, and the importance of maintenance B12 injections and follow up were impressed upon in both.  This information is being faxed to Dr. Margaretmary Eddy office, so that he will be available to him at the time of this follow up.  The patient will require ongoing B12 maintenance therapy as discussed above.  Additionally, it is suggested that his CBC be obtained at the patient's follow up.  Of note, the patient's discharge hemoglobin is 9.6.  Furthermore,  titration in the patient's blood pressure and diabetic medications will likely be required.  HOSPITAL COURSE:  [name] is a 77 year old gentleman who lives independently in the [place] area.  He was originally admitted to [facility] on October 06, 2010 where he presented with a GI bleed.  He received a total of 5 units of blood transfused while at [facility]. Unfortunately, he remained hypotensive and had symptoms of ongoing bleeding.  He had undergone a colonoscopy per Dr. Conard Novak which revealed severe diverticulosis throughout the colon.  A bleeding diverticulum in the proximal transverse colon was appreciated and two hemoclips were deployed.  Unfortunately, the patient remained stable.  He was also refusing care by physicians at the [facility].  As a result, he was transferred to [facility] for ongoing care.  Dr. Dulce Sellar was consulted for GI evaluation.  The patient required transfusion of two additional units of blood during his hospital stay.  A nuclear medicine bleeding scan was accomplished and unfortunately did not identify the source of the patient's blood loss.  The patient also became severely agitated and refused any further care.  Fortunately, he remained stable from a GI standpoint and his bleeding spontaneously resolved.  At the time of his discharge, he is having regular bowel movements which are brown.  His hemoglobin is stable as noted above.  He is advised to hold all anticoagulants/aspirin for a minimum of 2 weeks and consider resuming aspirin only 81 mg if cleared by his primary care physician after that time frame.  As eluded to above, the patient developed significant confusion during his hospital stay.  A full evaluation was carried out to investigate this.  This included assessment of vitamin B12 level.  The patient's vitamin B12 level actually proved to be severely diminished at 85.  The patient and his family were later able to report that  the patient was in fact taking high doses of oral vitamin B12 at home.  It appears clear that the patient has a pernicious anemia and we will therefore no longer be a candidate for oral B12 replacement.  Despite his initial agitation and combativeness with aggressive B12 subcutaneous  replacement, he improved significantly.  At the time of his discharge, I have discussed his mental status with his power of attorney and she states that he has in fact return to his baseline.  It has been impressed upon the power of attorney and the patient himself that a failure to follow up with ongoing B12 injections will again result in severe neurologic consequences to include severe agitation, and even focal neurologic deficits with the worse case scenario being even potential comatose state.  After the patient's presentation to [facility], it was appreciated that he had an elevation in his cardiac enzymes.  EKG was suggestive of possible ischemia.  Cardiology was consulted to evaluate the patient. It is felt that he was suffering with a demand ischemia in the setting of his severe anemia related to his GI bleeding.  The patient refused to have any further cardiac evaluation.  Unfortunately, however, he was able to be managed medically only.  An echo was obtained and revealed no evidence of severe focal wall motion abnormalities to suggest a large infarct.  The patient remained stable otherwise from a cardiovascular standpoint.  His atrial fibrillation has been controlled throughout his hospital stay.  Clearly, he is not a candidate for anticoagulation at this time.  On October 17, 2010, the patient is deemed to be stable medically for discharge home.  His vital signs are stable and he is afebrile.  Family has confirmed that he is back to his baseline mental status.  Home Health, Physical Therapy and Occupational Therapy are being arranged. The patient has been offered a rehabilitative stay at a  short-term skilled nursing facility with the patient himself has adamantly refused this.  At this point, he is not deemed to be severely enough, diminished mentally that he could be declared incompetent.     Lonia Blood, M.D.     JTM/MEDQ  D:  10/17/2010  T:  10/17/2010  Job:  [id number]  cc:   Dr. Sherryll Burger  Electronically Signed by Jetty Duhamel M.D. on 12/01/2010 09:46:53 AM

## 2012-05-30 DIAGNOSIS — I214 Non-ST elevation (NSTEMI) myocardial infarction: Secondary | ICD-10-CM

## 2012-05-30 DIAGNOSIS — I4891 Unspecified atrial fibrillation: Secondary | ICD-10-CM

## 2012-05-31 DIAGNOSIS — R079 Chest pain, unspecified: Secondary | ICD-10-CM

## 2012-05-31 DIAGNOSIS — I4891 Unspecified atrial fibrillation: Secondary | ICD-10-CM

## 2012-06-03 DIAGNOSIS — R072 Precordial pain: Secondary | ICD-10-CM

## 2012-06-07 ENCOUNTER — Encounter: Payer: Self-pay | Admitting: Cardiology

## 2012-06-13 IMAGING — CR DG CHEST 2V
1 series · 1 of 1 positions shown · non-contrast
Comparison: 03/11/2009

CLINICAL DATA: Gastrointestinal bleeding

CHEST - 2 VIEW

[w chest lat *]
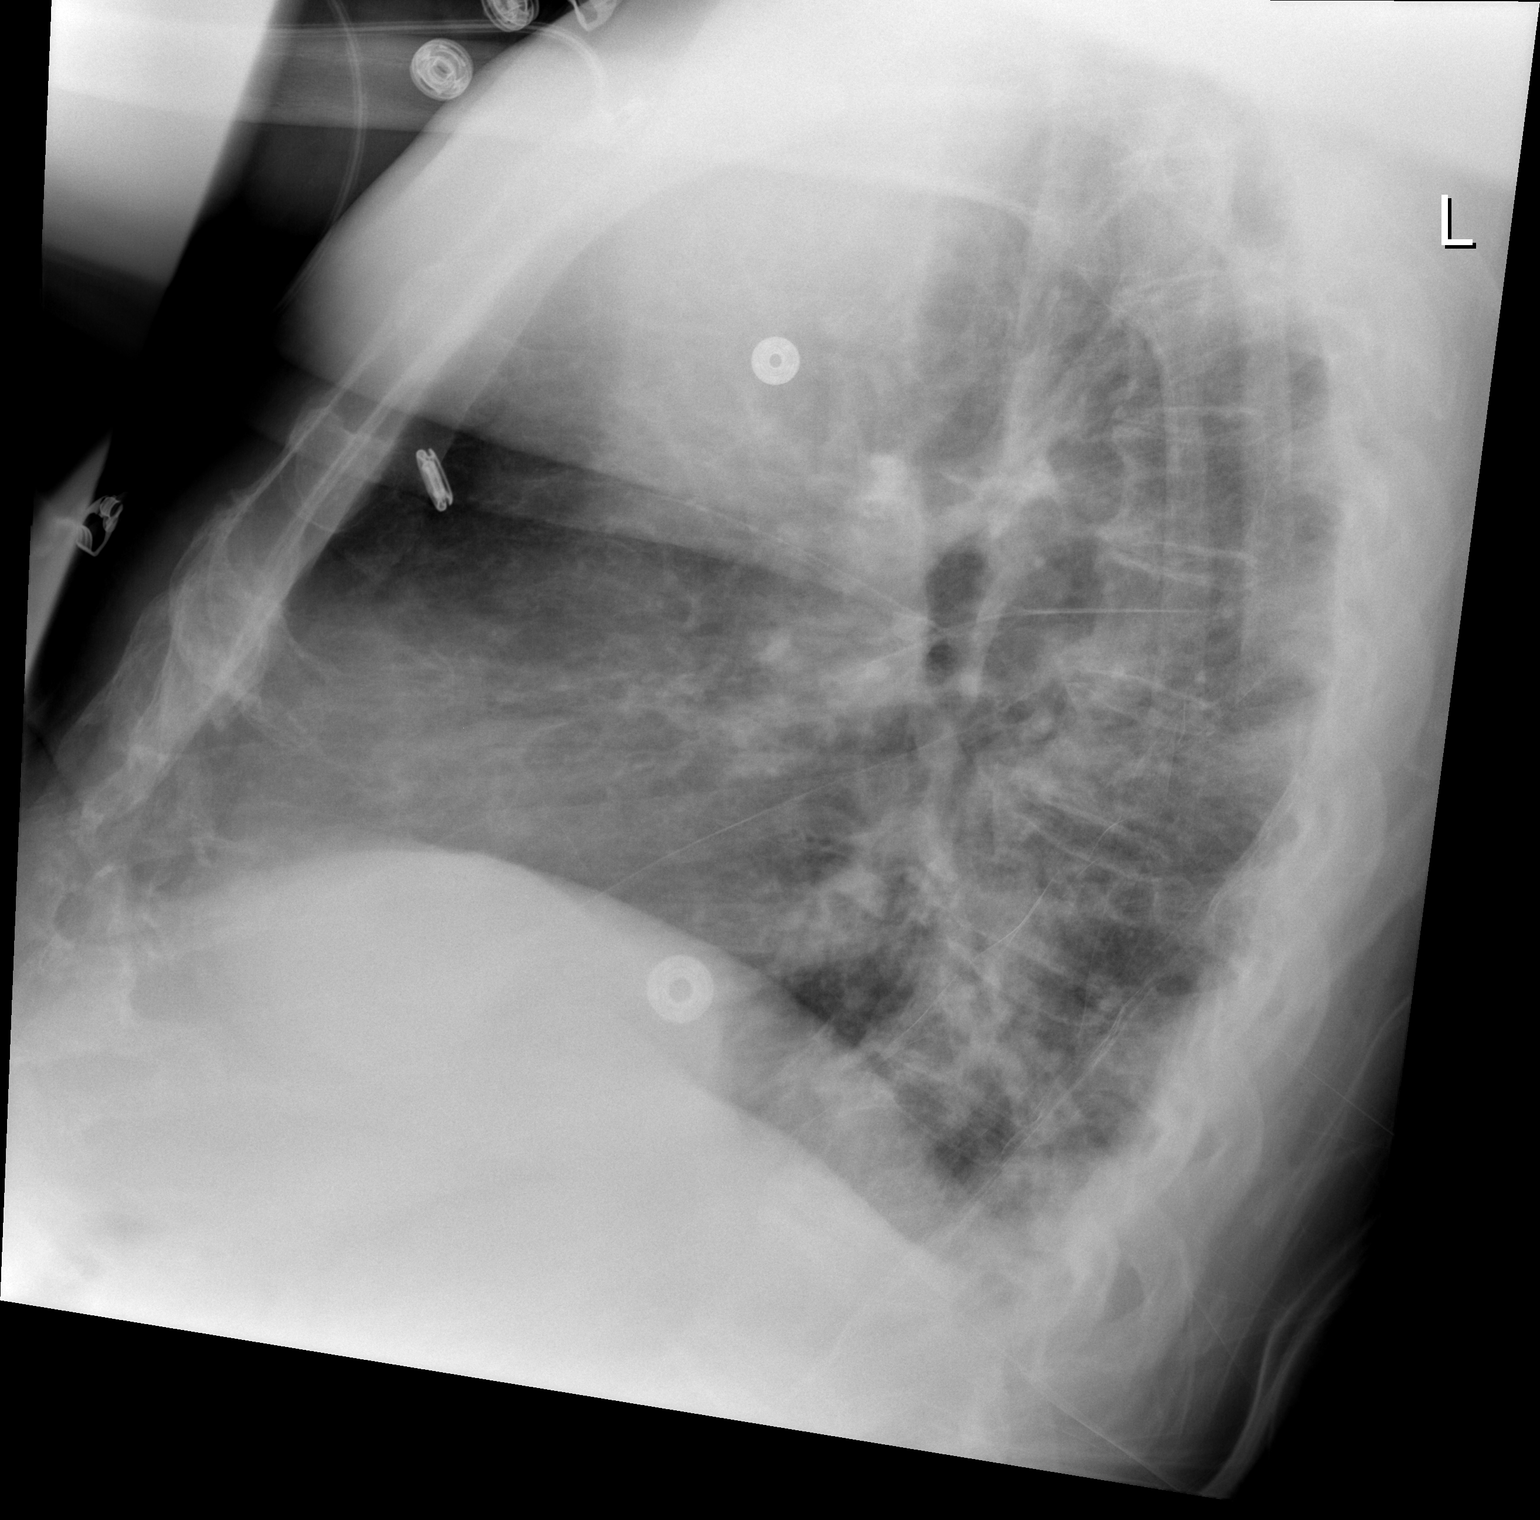

[1 of 1 positions shown; findings below may reference images not displayed]

FINDINGS: Mild cardiomegaly.  Patchy airspace opacities in the
posterior left lower lobe.  No pneumothorax and no pleural
effusion.  Pulmonary vascularity is within normal limits.
Bronchitic changes.
IMPRESSION: Patchy left lower lobe airspace disease.

## 2012-06-28 ENCOUNTER — Encounter: Payer: Medicare Other | Admitting: Cardiology

## 2012-07-04 ENCOUNTER — Ambulatory Visit (INDEPENDENT_AMBULATORY_CARE_PROVIDER_SITE_OTHER): Payer: Medicare Other | Admitting: Cardiology

## 2012-07-04 ENCOUNTER — Encounter: Payer: Self-pay | Admitting: Cardiology

## 2012-07-04 VITALS — BP 104/62 | HR 64 | Wt 174.0 lb

## 2012-07-04 DIAGNOSIS — I4891 Unspecified atrial fibrillation: Secondary | ICD-10-CM | POA: Insufficient documentation

## 2012-07-04 DIAGNOSIS — I255 Ischemic cardiomyopathy: Secondary | ICD-10-CM | POA: Insufficient documentation

## 2012-07-04 DIAGNOSIS — Z8719 Personal history of other diseases of the digestive system: Secondary | ICD-10-CM

## 2012-07-04 DIAGNOSIS — I779 Disorder of arteries and arterioles, unspecified: Secondary | ICD-10-CM

## 2012-07-04 DIAGNOSIS — I38 Endocarditis, valve unspecified: Secondary | ICD-10-CM | POA: Insufficient documentation

## 2012-07-04 DIAGNOSIS — I2510 Atherosclerotic heart disease of native coronary artery without angina pectoris: Secondary | ICD-10-CM

## 2012-07-04 DIAGNOSIS — I2589 Other forms of chronic ischemic heart disease: Secondary | ICD-10-CM

## 2012-07-04 NOTE — Assessment & Plan Note (Signed)
Based on recent echocardiogram, LVEF 30-35%. Almost certainly has underlying CAD, potentially multivessel with long-standing diabetes. [name] is quite clear in indicating that he does not want to pursue any invasive workup. Will manage him conservatively. Medical therapy is limited with progressive renal insufficiency, not on ACE inhibitor or ARB, and low resting heart rate, not on beta blocker.

## 2012-07-04 NOTE — Assessment & Plan Note (Signed)
High-grade LICA by recent carotid Dopplers. As already noted, [name] does not want to pursue any invasive measures.

## 2012-07-04 NOTE — Assessment & Plan Note (Signed)
Moderate MR and at least moderate AS, both to be managed conservatively.

## 2012-07-04 NOTE — Progress Notes (Signed)
Clinical Summary Mr. Vernon is a 79 y.o.male referred to the office by Dr. Sherryll Burger. He was seen in consultation back in March with atrial fibrillation and chest pain. He ruled in for a type II NSTEMI in the setting of GI bleeding and hypotension. He was managed conservatively with medical therapy.   Echocardiogram revealed LVEF of 30-35% with septal flattening and wall motion abnormalities consistent with ischemic cardiomyopathy, moderate left atrial enlargement, moderate MR, at least moderate AS although with low gradient, severe RV dilatation and dysfunction, mild right atrial enlargement, moderate TR with RVSP 47 mmHg, small pericardial effusion.  Carotid Dopplers demonstrated high-grade stenosis of the LICA, no significant stenosis of the RICA. Lab work demonstrated renal insufficiency with creatinine 2.3 ultimately down to 1.4, hemoglobin 7.4 up to 11.6 after packed red cell transfusions.  Medical regimen as outlined below. He has not been on beta blocker with relatively low resting heart rate also no ACE inhibitor or ARB with progressive renal insufficiency.  He is here with an Geophysicist/field seismologist from his resident facility. He has been using a wheelchair. He reports neuropathic foot and leg pain. Fortunately, has had no progressive shortness of breath or angina. He seems to have a limited understanding of his medical conditions. I reviewed this with him today.   No Known Allergies  Current Outpatient Prescriptions  Medication Sig Dispense Refill  . albuterol (PROVENTIL) (2.5 MG/3ML) 0.083% nebulizer solution Take 2.5 mg by nebulization every 6 (six) hours as needed for wheezing.      Marland Kitchen amiodarone (PACERONE) 200 MG tablet Take 200 mg by mouth daily.      Marland Kitchen aspirin 81 MG tablet Take 81 mg by mouth daily.      . cyanocobalamin (,VITAMIN B-12,) 1000 MCG/ML injection as directed.      . furosemide (LASIX) 20 MG tablet Take 20 mg by mouth daily.      . insulin aspart (NOVOLOG) 100 UNIT/ML injection  Inject into the skin 3 (three) times daily with meals. Sliding scale      . ipratropium-albuterol (DUONEB) 0.5-2.5 (3) MG/3ML SOLN Take 3 mLs by nebulization.      Marland Kitchen levofloxacin (LEVAQUIN) 750 MG tablet Take 750 mg by mouth daily.      . metFORMIN (GLUCOPHAGE) 1000 MG tablet Take 1,000 mg by mouth 2 (two) times daily with a meal.      . potassium chloride (K-DUR) 10 MEQ tablet Take 10 mEq by mouth daily.       No current facility-administered medications for this visit.    Past Medical History  Diagnosis Date  . Essential hypertension, benign   . Type 2 diabetes mellitus   . Coronary atherosclerosis of native coronary artery     Presumed and managed conservatively  . History of GI diverticular bleed   . History of gastric ulcer   . History of skin cancer   . Permanent atrial fibrillation   . NSTEMI (non-ST elevated myocardial infarction)   . Ischemic cardiomyopathy     LVEF 30-35%    Past Surgical History  Procedure Laterality Date  . Abdominal surgery      Social History Mr. Velardi reports that he quit smoking about 25 years ago. He does not have any smokeless tobacco history on file. Mr. Haycraft reports that he does not drink alcohol.  Review of Systems No palpitations. No reported melena or hematochezia. Appetite is "good." No falls.  Physical Examination Filed Vitals:   07/04/12 1500  BP: 104/62  Pulse: 64  Filed Weights   07/04/12 1500  Weight: 174 lb (78.926 kg)   Chronically ill-appearing male in no acute distress. HEENT: Conjunctiva and lids normal, oropharynx clear with poor dentition. Neck: Supple, no elevated JVP bilateral carotid bruits, no thyromegaly. Lungs: Diminished but cear to auscultation, nonlabored breathing at rest. Cardiac: Regular rate and rhythm, no S3, 2/6 systolic murmur, no pericardial rub. Abdomen: Soft, nontender, bowel sounds present. Extremities: Trace edema, distal pulses 1+. Skin: Warm and dry. Musculoskeletal: No  kyphosis. Neuropsychiatric: Alert and oriented x3, affect grossly appropriate.   Problem List and Plan   Atrial fibrillation Currently in sinus rhythm. Amiodarone will be reduced to 200 mg once daily. He has been able to tolerate aspirin, but is not a good candidate for anticoagulation with recurring severe GI bleeds.  Cardiomyopathy, ischemic Based on recent echocardiogram, LVEF 30-35%. Almost certainly has underlying CAD, potentially multivessel with long-standing diabetes. Mr. Carriger is quite clear in indicating that he does not want to pursue any invasive workup. Will manage him conservatively. Medical therapy is limited with progressive renal insufficiency, not on ACE inhibitor or ARB, and low resting heart rate, not on beta blocker.  CKD (chronic kidney disease) stage 3, GFR 30-59 ml/min Recent creatinine 2.3.  History of GI bleed Recurrent over the years, severe, status post PRBC transfusions. Presenting hemoglobin was 7.4 recently.  Valvular heart disease Moderate MR and at least moderate AS, both to be managed conservatively.  Carotid artery disease High-grade LICA by recent carotid Dopplers. As already noted, Mr. Shinault does not want to pursue any invasive measures.    Jonelle Sidle, M.D., F.A.C.C.

## 2012-07-04 NOTE — Patient Instructions (Addendum)
Your physician recommends that you schedule a follow-up appointment in: 6 months. You will receive a reminder letter in the mail in about 4 months reminding you to call and schedule your appointment. If you don't receive this letter, please contact our office. Your physician has recommended you make the following change in your medication: Amiodarone dose is 200 mg daily instead of 400 mg. All other medications will remain the same.

## 2012-07-04 NOTE — Assessment & Plan Note (Signed)
Recent creatinine 2.3. 

## 2012-07-04 NOTE — Assessment & Plan Note (Signed)
Currently in sinus rhythm. Amiodarone will be reduced to 200 mg once daily. He has been able to tolerate aspirin, but is not a good candidate for anticoagulation with recurring severe GI bleeds.

## 2012-07-04 NOTE — Assessment & Plan Note (Signed)
Recurrent over the years, severe, status post PRBC transfusions. Presenting hemoglobin was 7.4 recently.

## 2013-01-08 ENCOUNTER — Ambulatory Visit (INDEPENDENT_AMBULATORY_CARE_PROVIDER_SITE_OTHER): Payer: Medicare Other | Admitting: Cardiology

## 2013-01-08 ENCOUNTER — Encounter: Payer: Self-pay | Admitting: Cardiology

## 2013-01-08 VITALS — BP 148/65 | HR 69 | Ht 73.0 in | Wt 227.0 lb

## 2013-01-08 DIAGNOSIS — I255 Ischemic cardiomyopathy: Secondary | ICD-10-CM

## 2013-01-08 DIAGNOSIS — I4891 Unspecified atrial fibrillation: Secondary | ICD-10-CM

## 2013-01-08 DIAGNOSIS — I2589 Other forms of chronic ischemic heart disease: Secondary | ICD-10-CM

## 2013-01-08 DIAGNOSIS — I779 Disorder of arteries and arterioles, unspecified: Secondary | ICD-10-CM

## 2013-01-08 NOTE — Progress Notes (Signed)
Clinical Summary [name] is an 79 y.o.male last seen in May of this year. Still resides in a nursing facility. He is being managed conservatively for probable ischemic cardiomyopathy based on testing earlier in the year. He has been clear in indicating that he does not want to pursue any invasive evaluation.  Echocardiogram in March revealed LVEF of 30-35% with septal flattening and wall motion abnormalities consistent with ischemic cardiomyopathy, moderate left atrial enlargement, moderate MR, at least moderate AS although with low gradient, severe RV dilatation and dysfunction, mild right atrial enlargement, moderate TR with RVSP 47 mmHg, small pericardial effusion.  Recent lab work reviewed finding sodium 135, potassium 5.0 (supplements stopped), BUN 29, creatinine 1.4, cholesterol 164, HDL 41, LDL 91, triglycerides 161, TSH 2.7, hemoglobin A1c 9.4%.  He reports mild chronic leg edema, no change. Very functionally limited, he is able to stand but not walk. States he has a very good appetite, "eats anything I can." Weight has gone up significantly from the last visit.  No Known Allergies  Current Outpatient Prescriptions  Medication Sig Dispense Refill  . acetaminophen (TYLENOL) 650 MG CR tablet Take 650 mg by mouth every 8 (eight) hours as needed for pain.      Marland Kitchen albuterol (PROVENTIL) (2.5 MG/3ML) 0.083% nebulizer solution Take 2.5 mg by nebulization every 6 (six) hours as needed for wheezing.      Marland Kitchen amiodarone (PACERONE) 200 MG tablet Take 200 mg by mouth daily.      Marland Kitchen aspirin 81 MG tablet Take 81 mg by mouth daily.      . cyanocobalamin (,VITAMIN B-12,) 1000 MCG/ML injection as directed.      . furosemide (LASIX) 20 MG tablet Take 20 mg by mouth daily.      . insulin aspart (NOVOLOG) 100 UNIT/ML injection Inject 10 Units into the skin daily. Sliding scale      . insulin glargine (LANTUS) 100 UNIT/ML injection Inject 15-60 Units into the skin 2 (two) times daily.       Marland Kitchen  ipratropium-albuterol (DUONEB) 0.5-2.5 (3) MG/3ML SOLN Take 3 mLs by nebulization 2 (two) times daily.       . Multiple Vitamin (MULTIVITAMIN) tablet Take 1 tablet by mouth daily.       No current facility-administered medications for this visit.    Past Medical History  Diagnosis Date  . Essential hypertension, benign   . Type 2 diabetes mellitus   . Coronary atherosclerosis of native coronary artery     Presumed and managed conservatively  . History of GI diverticular bleed   . History of gastric ulcer   . History of skin cancer   . Permanent atrial fibrillation   . NSTEMI (non-ST elevated myocardial infarction)   . Ischemic cardiomyopathy     LVEF 30-35%    Social History [name] reports that he quit smoking about 25 years ago. He does not have any smokeless tobacco history on file. [name] reports that he does not drink alcohol.  Review of Systems As outlined above. Otherwise negative.  Physical Examination Filed Vitals:   01/08/13 1331  BP: 148/65  Pulse: 69   Filed Weights   01/08/13 1331  Weight: 227 lb (102.967 kg)    Chronically ill-appearing male in no acute distress. In wheelchair. HEENT: Conjunctiva and lids normal, oropharynx clear with poor dentition.  Neck: Supple, no elevated JVP bilateral carotid bruits, no thyromegaly.  Lungs: Diminished but cear to auscultation, nonlabored breathing at rest.  Cardiac: Regular rate  and rhythm, no S3, 2/6 systolic murmur, no pericardial rub.  Abdomen: Soft, nontender, bowel sounds present.  Extremities: 1+ edema, distal pulses 1+.  Skin: Warm and dry.  Musculoskeletal: No kyphosis.  Neuropsychiatric: Alert and oriented x3, affect grossly appropriate.   Problem List and Plan   Cardiomyopathy, ischemic Being managed conservatively at patient request. LVEF 30-35% by assessment earlier in the year. Current regimen is minimal, includes aspirin and Lasix. May want to consider more regular blood pressure checks at  the nursing facility, and if in fact his blood pressure trend is going up, may want to consider adding hydralazine and nitrate for afterload reduction. ACE inhibitors and ARB being avoided with acute renal failure previously and propensity to hyperkalemia.   Atrial fibrillation Amiodarone will continue at 200 mg once daily. He has been able to tolerate aspirin, but is not a good candidate for anticoagulation with recurring severe GI bleeds.  Carotid artery disease High-grade LICA by carotid Dopplers. As already noted, [name] does not want to pursue any invasive measures.    Jonelle Sidle, M.D., F.A.C.C.

## 2013-01-08 NOTE — Assessment & Plan Note (Addendum)
High-grade LICA by carotid Dopplers. As already noted, [name] does not want to pursue any invasive measures.

## 2013-01-08 NOTE — Patient Instructions (Signed)

## 2013-01-08 NOTE — Assessment & Plan Note (Addendum)
Amiodarone will continue at 200 mg once daily. He has been able to tolerate aspirin, but is not a good candidate for anticoagulation with recurring severe GI bleeds.

## 2013-01-08 NOTE — Assessment & Plan Note (Signed)
Being managed conservatively at patient request. LVEF 30-35% by assessment earlier in the year. Current regimen is minimal, includes aspirin and Lasix. May want to consider more regular blood pressure checks at the nursing facility, and if in fact his blood pressure trend is going up, may want to consider adding hydralazine and nitrate for afterload reduction. ACE inhibitors and ARB being avoided with acute renal failure previously and propensity to hyperkalemia.

## 2013-06-17 ENCOUNTER — Encounter (HOSPITAL_COMMUNITY): Payer: Self-pay | Admitting: Emergency Medicine

## 2013-06-17 ENCOUNTER — Other Ambulatory Visit: Payer: Self-pay

## 2013-06-17 ENCOUNTER — Emergency Department (HOSPITAL_COMMUNITY): Payer: Medicare Other

## 2013-06-17 ENCOUNTER — Inpatient Hospital Stay (HOSPITAL_COMMUNITY)
Admission: EM | Admit: 2013-06-17 | Discharge: 2013-06-20 | DRG: 280 | Disposition: A | Discharge status: Skilled Nursing Facility | Payer: Medicare Other | Attending: Internal Medicine | Admitting: Internal Medicine

## 2013-06-17 DIAGNOSIS — N184 Chronic kidney disease, stage 4 (severe): Secondary | ICD-10-CM | POA: Diagnosis present

## 2013-06-17 DIAGNOSIS — Z8249 Family history of ischemic heart disease and other diseases of the circulatory system: Secondary | ICD-10-CM

## 2013-06-17 DIAGNOSIS — L02419 Cutaneous abscess of limb, unspecified: Secondary | ICD-10-CM | POA: Diagnosis present

## 2013-06-17 DIAGNOSIS — J449 Chronic obstructive pulmonary disease, unspecified: Secondary | ICD-10-CM | POA: Diagnosis present

## 2013-06-17 DIAGNOSIS — E119 Type 2 diabetes mellitus without complications: Secondary | ICD-10-CM | POA: Diagnosis present

## 2013-06-17 DIAGNOSIS — I38 Endocarditis, valve unspecified: Secondary | ICD-10-CM

## 2013-06-17 DIAGNOSIS — I2510 Atherosclerotic heart disease of native coronary artery without angina pectoris: Secondary | ICD-10-CM | POA: Diagnosis present

## 2013-06-17 DIAGNOSIS — I319 Disease of pericardium, unspecified: Secondary | ICD-10-CM

## 2013-06-17 DIAGNOSIS — I214 Non-ST elevation (NSTEMI) myocardial infarction: Secondary | ICD-10-CM | POA: Diagnosis present

## 2013-06-17 DIAGNOSIS — J4489 Other specified chronic obstructive pulmonary disease: Secondary | ICD-10-CM | POA: Diagnosis present

## 2013-06-17 DIAGNOSIS — N183 Chronic kidney disease, stage 3 unspecified: Secondary | ICD-10-CM

## 2013-06-17 DIAGNOSIS — I4891 Unspecified atrial fibrillation: Secondary | ICD-10-CM

## 2013-06-17 DIAGNOSIS — Z8719 Personal history of other diseases of the digestive system: Secondary | ICD-10-CM

## 2013-06-17 DIAGNOSIS — Z87891 Personal history of nicotine dependence: Secondary | ICD-10-CM

## 2013-06-17 DIAGNOSIS — L8993 Pressure ulcer of unspecified site, stage 3: Secondary | ICD-10-CM | POA: Diagnosis present

## 2013-06-17 DIAGNOSIS — I252 Old myocardial infarction: Secondary | ICD-10-CM

## 2013-06-17 DIAGNOSIS — D649 Anemia, unspecified: Secondary | ICD-10-CM | POA: Diagnosis present

## 2013-06-17 DIAGNOSIS — I249 Acute ischemic heart disease, unspecified: Secondary | ICD-10-CM | POA: Diagnosis present

## 2013-06-17 DIAGNOSIS — Z794 Long term (current) use of insulin: Secondary | ICD-10-CM

## 2013-06-17 DIAGNOSIS — Z85828 Personal history of other malignant neoplasm of skin: Secondary | ICD-10-CM

## 2013-06-17 DIAGNOSIS — R32 Unspecified urinary incontinence: Secondary | ICD-10-CM | POA: Diagnosis present

## 2013-06-17 DIAGNOSIS — I872 Venous insufficiency (chronic) (peripheral): Secondary | ICD-10-CM

## 2013-06-17 DIAGNOSIS — I129 Hypertensive chronic kidney disease with stage 1 through stage 4 chronic kidney disease, or unspecified chronic kidney disease: Secondary | ICD-10-CM | POA: Diagnosis present

## 2013-06-17 DIAGNOSIS — Z7982 Long term (current) use of aspirin: Secondary | ICD-10-CM

## 2013-06-17 DIAGNOSIS — L03119 Cellulitis of unspecified part of limb: Secondary | ICD-10-CM

## 2013-06-17 DIAGNOSIS — I2589 Other forms of chronic ischemic heart disease: Secondary | ICD-10-CM

## 2013-06-17 DIAGNOSIS — L89309 Pressure ulcer of unspecified buttock, unspecified stage: Secondary | ICD-10-CM | POA: Diagnosis present

## 2013-06-17 DIAGNOSIS — I4892 Unspecified atrial flutter: Secondary | ICD-10-CM | POA: Diagnosis present

## 2013-06-17 DIAGNOSIS — L98499 Non-pressure chronic ulcer of skin of other sites with unspecified severity: Secondary | ICD-10-CM | POA: Diagnosis present

## 2013-06-17 DIAGNOSIS — I739 Peripheral vascular disease, unspecified: Secondary | ICD-10-CM

## 2013-06-17 DIAGNOSIS — I255 Ischemic cardiomyopathy: Secondary | ICD-10-CM | POA: Diagnosis present

## 2013-06-17 DIAGNOSIS — I779 Disorder of arteries and arterioles, unspecified: Secondary | ICD-10-CM | POA: Diagnosis present

## 2013-06-17 DIAGNOSIS — I359 Nonrheumatic aortic valve disorder, unspecified: Secondary | ICD-10-CM | POA: Diagnosis present

## 2013-06-17 DIAGNOSIS — Z66 Do not resuscitate: Secondary | ICD-10-CM | POA: Diagnosis present

## 2013-06-17 HISTORY — DX: Anemia, unspecified: D64.9

## 2013-06-17 HISTORY — DX: Acute kidney failure, unspecified: N17.9

## 2013-06-17 HISTORY — DX: Orthostatic hypotension: I95.1

## 2013-06-17 LAB — CBC
HCT: 35 % — ABNORMAL LOW (ref 39.0–52.0)
HCT: 34 % — ABNORMAL LOW (ref 39.0–52.0)
Hemoglobin: 11.3 g/dL — ABNORMAL LOW (ref 13.0–17.0)
Hemoglobin: 10.9 g/dL — ABNORMAL LOW (ref 13.0–17.0)
MCH: 27.8 pg (ref 26.0–34.0)
MCH: 27.5 pg (ref 26.0–34.0)
MCHC: 32.3 g/dL (ref 30.0–36.0)
MCHC: 32.1 g/dL (ref 30.0–36.0)
MCV: 86.2 fL (ref 78.0–100.0)
MCV: 85.9 fL (ref 78.0–100.0)
Platelets: 191 10*3/uL (ref 150–400)
Platelets: 175 10*3/uL (ref 150–400)
RBC: 4.06 MIL/uL — ABNORMAL LOW (ref 4.22–5.81)
RBC: 3.96 MIL/uL — ABNORMAL LOW (ref 4.22–5.81)
RDW: 17.5 % — ABNORMAL HIGH (ref 11.5–15.5)
RDW: 17.4 % — ABNORMAL HIGH (ref 11.5–15.5)
WBC: 7.4 10*3/uL (ref 4.0–10.5)
WBC: 7.5 10*3/uL (ref 4.0–10.5)

## 2013-06-17 LAB — GLUCOSE, CAPILLARY
GLUCOSE-CAPILLARY: 176 mg/dL — AB (ref 70–99)
GLUCOSE-CAPILLARY: 312 mg/dL — AB (ref 70–99)
GLUCOSE-CAPILLARY: 237 mg/dL — AB (ref 70–99)
GLUCOSE-CAPILLARY: 183 mg/dL — AB (ref 70–99)

## 2013-06-17 LAB — COMPREHENSIVE METABOLIC PANEL WITH GFR
ALT: 12 U/L (ref 0–53)
ALT: 10 U/L (ref 0–53)
AST: 31 U/L (ref 0–37)
AST: 17 U/L (ref 0–37)
Albumin: 3.4 g/dL — ABNORMAL LOW (ref 3.5–5.2)
Albumin: 3.3 g/dL — ABNORMAL LOW (ref 3.5–5.2)
Alkaline Phosphatase: 73 U/L (ref 39–117)
Alkaline Phosphatase: 71 U/L (ref 39–117)
BUN: 25 mg/dL — ABNORMAL HIGH (ref 6–23)
BUN: 24 mg/dL — ABNORMAL HIGH (ref 6–23)
CO2: 29 meq/L (ref 19–32)
CO2: 29 meq/L (ref 19–32)
Calcium: 9.5 mg/dL (ref 8.4–10.5)
Calcium: 9.2 mg/dL (ref 8.4–10.5)
Chloride: 92 meq/L — ABNORMAL LOW (ref 96–112)
Chloride: 95 meq/L — ABNORMAL LOW (ref 96–112)
Creatinine, Ser: 1.54 mg/dL — ABNORMAL HIGH (ref 0.50–1.35)
Creatinine, Ser: 1.43 mg/dL — ABNORMAL HIGH (ref 0.50–1.35)
GFR calc Af Amer: 47 mL/min — ABNORMAL LOW
GFR calc Af Amer: 52 mL/min — ABNORMAL LOW
GFR calc non Af Amer: 41 mL/min — ABNORMAL LOW
GFR calc non Af Amer: 45 mL/min — ABNORMAL LOW
Glucose, Bld: 261 mg/dL — ABNORMAL HIGH (ref 70–99)
Glucose, Bld: 198 mg/dL — ABNORMAL HIGH (ref 70–99)
Potassium: 4.5 meq/L (ref 3.7–5.3)
Potassium: 4.1 meq/L (ref 3.7–5.3)
Sodium: 134 meq/L — ABNORMAL LOW (ref 137–147)
Sodium: 136 meq/L — ABNORMAL LOW (ref 137–147)
Total Bilirubin: 0.2 mg/dL — ABNORMAL LOW (ref 0.3–1.2)
Total Bilirubin: 0.3 mg/dL (ref 0.3–1.2)
Total Protein: 7.6 g/dL (ref 6.0–8.3)
Total Protein: 7.1 g/dL (ref 6.0–8.3)

## 2013-06-17 LAB — TROPONIN I
Troponin I: 0.84 ng/mL (ref <0.30)
Troponin I: 0.94 ng/mL (ref <0.30)
Troponin I: 0.79 ng/mL (ref <0.30)
Troponin I: 0.9 ng/mL (ref <0.30)

## 2013-06-17 LAB — APTT: aPTT: 33 s (ref 24–37)

## 2013-06-17 LAB — PROTIME-INR
INR: 1.08 (ref 0.00–1.49)
PROTHROMBIN TIME: 13.8 s (ref 11.6–15.2)

## 2013-06-17 LAB — MRSA PCR SCREENING: MRSA by PCR: NEGATIVE

## 2013-06-17 LAB — HEPARIN LEVEL (UNFRACTIONATED): HEPARIN UNFRACTIONATED: 0.21 [IU]/mL — AB (ref 0.30–0.70)

## 2013-06-17 MED ORDER — SODIUM CHLORIDE 0.9 % IV SOLN: 250.0000 mL | INTRAVENOUS | Status: DC | PRN

## 2013-06-17 MED ORDER — ONDANSETRON HCL 4 MG/2ML IJ SOLN: 4.0000 mg | Freq: Four times a day (QID) | INTRAMUSCULAR | Status: DC | PRN

## 2013-06-17 MED ORDER — MORPHINE SULFATE 2 MG/ML IJ SOLN: 2.0000 mg | INTRAMUSCULAR | Status: DC | PRN

## 2013-06-17 MED ORDER — DOXYCYCLINE HYCLATE 100 MG PO TABS
100.0000 mg | ORAL_TABLET | Freq: Two times a day (BID) | ORAL | Status: DC
  Administered 2013-06-17 – 2013-06-20 (×7): 100 mg via ORAL
  Filled 2013-06-17 (×7): qty 1

## 2013-06-17 MED ORDER — FUROSEMIDE 40 MG PO TABS
40.0000 mg | ORAL_TABLET | Freq: Every day | ORAL | Status: DC
  Administered 2013-06-17 – 2013-06-20 (×4): 40 mg via ORAL
  Filled 2013-06-17 (×4): qty 1

## 2013-06-17 MED ORDER — ACETAMINOPHEN 325 MG PO TABS
650.0000 mg | ORAL_TABLET | Freq: Three times a day (TID) | ORAL | Status: DC | PRN
  Administered 2013-06-17 – 2013-06-20 (×3): 650 mg via ORAL
  Filled 2013-06-17 (×4): qty 2

## 2013-06-17 MED ORDER — DILTIAZEM HCL 25 MG/5ML IV SOLN
INTRAVENOUS | Status: AC
  Filled 2013-06-17: qty 5

## 2013-06-17 MED ORDER — BIOTENE DRY MOUTH MT LIQD
15.0000 mL | Freq: Two times a day (BID) | OROMUCOSAL | Status: DC
  Administered 2013-06-17 – 2013-06-20 (×6): 15 mL via OROMUCOSAL

## 2013-06-17 MED ORDER — DILTIAZEM HCL 100 MG IV SOLR
5.0000 mg/h | INTRAVENOUS | Status: DC
  Administered 2013-06-17: 5 mg/h via INTRAVENOUS
  Filled 2013-06-17: qty 100

## 2013-06-17 MED ORDER — INSULIN GLARGINE 100 UNIT/ML ~~LOC~~ SOLN
60.0000 [IU] | Freq: Two times a day (BID) | SUBCUTANEOUS | Status: DC
  Filled 2013-06-17: qty 0.6

## 2013-06-17 MED ORDER — ASPIRIN EC 325 MG PO TBEC
325.0000 mg | DELAYED_RELEASE_TABLET | Freq: Every day | ORAL | Status: DC
  Administered 2013-06-17 – 2013-06-20 (×5): 325 mg via ORAL
  Filled 2013-06-17 (×4): qty 1

## 2013-06-17 MED ORDER — SODIUM CHLORIDE 0.9 % IJ SOLN: 3.0000 mL | INTRAMUSCULAR | Status: DC | PRN

## 2013-06-17 MED ORDER — METOPROLOL TARTRATE 25 MG PO TABS
25.0000 mg | ORAL_TABLET | Freq: Two times a day (BID) | ORAL | Status: DC
  Administered 2013-06-17 – 2013-06-20 (×7): 25 mg via ORAL
  Filled 2013-06-17 (×7): qty 1

## 2013-06-17 MED ORDER — HEPARIN BOLUS VIA INFUSION
4000.0000 [IU] | Freq: Once | INTRAVENOUS | Status: AC
  Administered 2013-06-17: 4000 [IU] via INTRAVENOUS

## 2013-06-17 MED ORDER — NITROGLYCERIN 0.4 MG SL SUBL: 0.4000 mg | SUBLINGUAL_TABLET | SUBLINGUAL | Status: DC | PRN

## 2013-06-17 MED ORDER — ONDANSETRON HCL 4 MG/2ML IJ SOLN: 4.0000 mg | Freq: Three times a day (TID) | INTRAMUSCULAR | Status: DC | PRN

## 2013-06-17 MED ORDER — INSULIN ASPART 100 UNIT/ML ~~LOC~~ SOLN
0.0000 [IU] | Freq: Three times a day (TID) | SUBCUTANEOUS | Status: DC
  Administered 2013-06-17: 2 [IU] via SUBCUTANEOUS

## 2013-06-17 MED ORDER — DILTIAZEM HCL 100 MG IV SOLR
5.0000 mg/h | Freq: Once | INTRAVENOUS | Status: AC
  Administered 2013-06-17: 5 mg/h via INTRAVENOUS

## 2013-06-17 MED ORDER — ONDANSETRON HCL 4 MG PO TABS: 4.0000 mg | ORAL_TABLET | Freq: Four times a day (QID) | ORAL | Status: DC | PRN

## 2013-06-17 MED ORDER — AMIODARONE HCL 200 MG PO TABS
200.0000 mg | ORAL_TABLET | Freq: Every day | ORAL | Status: DC
  Administered 2013-06-17: 200 mg via ORAL
  Filled 2013-06-17: qty 1

## 2013-06-17 MED ORDER — ALBUTEROL SULFATE (2.5 MG/3ML) 0.083% IN NEBU: 2.5000 mg | INHALATION_SOLUTION | Freq: Four times a day (QID) | RESPIRATORY_TRACT | Status: DC | PRN

## 2013-06-17 MED ORDER — AMIODARONE HCL 200 MG PO TABS
200.0000 mg | ORAL_TABLET | Freq: Two times a day (BID) | ORAL | Status: DC
  Administered 2013-06-17 – 2013-06-20 (×6): 200 mg via ORAL
  Filled 2013-06-17 (×6): qty 1

## 2013-06-17 MED ORDER — ISOSORBIDE MONONITRATE ER 30 MG PO TB24
30.0000 mg | ORAL_TABLET | Freq: Every day | ORAL | Status: DC
  Administered 2013-06-17 – 2013-06-20 (×4): 30 mg via ORAL
  Filled 2013-06-17 (×4): qty 1

## 2013-06-17 MED ORDER — HEPARIN (PORCINE) IN NACL 100-0.45 UNIT/ML-% IJ SOLN
12.0000 [IU]/kg/h | INTRAMUSCULAR | Status: DC
  Administered 2013-06-17: 12 [IU]/kg/h via INTRAVENOUS
  Filled 2013-06-17: qty 250

## 2013-06-17 MED ORDER — INSULIN ASPART 100 UNIT/ML ~~LOC~~ SOLN
0.0000 [IU] | Freq: Every day | SUBCUTANEOUS | Status: DC
  Administered 2013-06-18: 3 [IU] via SUBCUTANEOUS

## 2013-06-17 MED ORDER — INSULIN ASPART 100 UNIT/ML ~~LOC~~ SOLN
0.0000 [IU] | Freq: Three times a day (TID) | SUBCUTANEOUS | Status: DC
  Administered 2013-06-17: 11 [IU] via SUBCUTANEOUS
  Administered 2013-06-17: 5 [IU] via SUBCUTANEOUS
  Administered 2013-06-18 (×2): 3 [IU] via SUBCUTANEOUS
  Administered 2013-06-18: 11 [IU] via SUBCUTANEOUS
  Administered 2013-06-19 (×2): 5 [IU] via SUBCUTANEOUS
  Administered 2013-06-19: 2 [IU] via SUBCUTANEOUS
  Administered 2013-06-20: 3 [IU] via SUBCUTANEOUS

## 2013-06-17 MED ORDER — IPRATROPIUM-ALBUTEROL 0.5-2.5 (3) MG/3ML IN SOLN
3.0000 mL | Freq: Two times a day (BID) | RESPIRATORY_TRACT | Status: DC
  Administered 2013-06-17 – 2013-06-20 (×7): 3 mL via RESPIRATORY_TRACT
  Filled 2013-06-17 (×6): qty 3

## 2013-06-17 MED ORDER — DILTIAZEM HCL 25 MG/5ML IV SOLN
20.0000 mg | Freq: Once | INTRAVENOUS | Status: AC
  Administered 2013-06-17: 20 mg via INTRAVENOUS

## 2013-06-17 MED ORDER — HEPARIN (PORCINE) IN NACL 100-0.45 UNIT/ML-% IJ SOLN
1300.0000 [IU]/h | INTRAMUSCULAR | Status: DC
  Administered 2013-06-17: 1300 [IU] via INTRAVENOUS

## 2013-06-17 MED ORDER — INSULIN GLARGINE 100 UNIT/ML ~~LOC~~ SOLN
60.0000 [IU] | Freq: Every day | SUBCUTANEOUS | Status: DC
  Administered 2013-06-17 – 2013-06-20 (×4): 60 [IU] via SUBCUTANEOUS
  Filled 2013-06-17 (×7): qty 0.6

## 2013-06-17 MED ORDER — SODIUM CHLORIDE 0.9 % IJ SOLN
3.0000 mL | Freq: Two times a day (BID) | INTRAMUSCULAR | Status: DC
  Administered 2013-06-17: 3 mL via INTRAVENOUS
  Administered 2013-06-18: 10 mL via INTRAVENOUS
  Administered 2013-06-18 – 2013-06-20 (×4): 3 mL via INTRAVENOUS

## 2013-06-17 MED ORDER — ASPIRIN EC 81 MG PO TBEC: 81.0000 mg | DELAYED_RELEASE_TABLET | Freq: Every day | ORAL | Status: DC

## 2013-06-17 MED ORDER — ATORVASTATIN CALCIUM 40 MG PO TABS
80.0000 mg | ORAL_TABLET | Freq: Every day | ORAL | Status: DC
  Administered 2013-06-18 – 2013-06-19 (×2): 80 mg via ORAL
  Filled 2013-06-17 (×2): qty 2

## 2013-06-17 MED ORDER — HEPARIN SODIUM (PORCINE) 5000 UNIT/ML IJ SOLN
5000.0000 [IU] | Freq: Three times a day (TID) | INTRAMUSCULAR | Status: DC
  Administered 2013-06-17 – 2013-06-20 (×8): 5000 [IU] via SUBCUTANEOUS
  Filled 2013-06-17 (×8): qty 1

## 2013-06-17 NOTE — ED Notes (Signed)
Gave patient water as requested and approved by Dr Sharl MaLama.

## 2013-06-17 NOTE — ED Notes (Signed)
CRITICAL VALUE ALERT  Critical value received:  Troponin - 0.84  Date of notification:  06/17/2013  Time of notification:  0216  Critical value read back: yes  Nurse who received alert:  Josph MachoLorrie Sheria Rosello RN  MD notified (1st page):  Dr Hyacinth MeekerMiller  Time of first page:  0217  MD notified (2nd page):  Time of second page:  Responding MD:  Dr Hyacinth MeekerMiller  Time MD responded:  [phone]

## 2013-06-17 NOTE — ED Provider Notes (Signed)
CSN: [id number]     Arrival date & time 06/17/13  0100 History   First MD Initiated Contact with Patient 06/17/13 0113     Chief Complaint  Patient presents with  . Chest Pain     (Consider location/radiation/quality/duration/timing/severity/associated sxs/prior Treatment) HPI Comments: Pt is a 80 y/o male with chronic history of uncontrolled afib and DM who presents to the hospital by EMS after complaining of chest pain on and off for a couple of days.  [facility] - was diagnosed with uncontrolled afib with rvr, NSTEMI and pneumonia at that time.  He has hx of GI bleed and thus no anticoagulation was recommended at that time (DNR as well) but he is on ASA.  He states that the heaviness in his chest resolved shortly after being given nitroglycerin by EMS and at this time he is pain free.  He has no significant palpitations, SOB, and denies fevers, cough, swelling, back pain or light headedness.  He is relatively inactive and states that he doesn't do any walking because of leg weakness and deconditioning which appears to be chronic based on his report.  At this time he is CP free but noted to be tachycardic.  During his previous hospitalization he was started on Amiodarone.  Patient is a 80 y.o. male presenting with chest pain. The history is provided by the patient.  Chest Pain   Past Medical History  Diagnosis Date  . Essential hypertension, benign   . Type 2 diabetes mellitus   . Coronary atherosclerosis of native coronary artery     Presumed and managed conservatively  . History of GI diverticular bleed   . History of gastric ulcer   . History of skin cancer   . Permanent atrial fibrillation   . NSTEMI (non-ST elevated myocardial infarction)   . Ischemic cardiomyopathy     LVEF 30-35%  . Orthostatic hypotension   . Acute kidney failure   . Anemia    Past Surgical History  Procedure  Laterality Date  . Abdominal surgery     History reviewed. No pertinent family history. History  Substance Use Topics  . Smoking status: Former Smoker    Quit date: 03/07/1987  . Smokeless tobacco: Not on file  . Alcohol Use: No    Review of Systems  Cardiovascular: Positive for chest pain.  All other systems reviewed and are negative.     Allergies  Review of patient's allergies indicates no known allergies.  Home Medications   Current Outpatient Rx  Name  Route  Sig  Dispense  Refill  . traMADol (ULTRAM) 50 MG tablet   Oral   Take by mouth 3 (three) times daily as needed.         Marland Kitchen. acetaminophen (TYLENOL) 650 MG CR tablet   Oral   Take 650 mg by mouth every 8 (eight) hours as needed for pain.         Marland Kitchen. albuterol (PROVENTIL) (2.5 MG/3ML) 0.083% nebulizer solution   Nebulization   Take 2.5 mg by nebulization every 6 (six) hours as needed for wheezing.         Marland Kitchen. amiodarone (PACERONE) 200 MG tablet   Oral   Take 200 mg by mouth daily.         Marland Kitchen. aspirin 81 MG tablet   Oral   Take 81 mg by mouth daily.         .Marland Kitchen  cyanocobalamin (,VITAMIN B-12,) 1000 MCG/ML injection      as directed.         . furosemide (LASIX) 20 MG tablet   Oral   Take 40 mg by mouth daily.          . insulin aspart (NOVOLOG) 100 UNIT/ML injection   Subcutaneous   Inject 10 Units into the skin daily. Sliding scale         . insulin glargine (LANTUS) 100 UNIT/ML injection   Subcutaneous   Inject 60 Units into the skin 2 (two) times daily. 60 units SQ QD at 0800 and 15 units SQ HS at 2000.         Marland Kitchen ipratropium-albuterol (DUONEB) 0.5-2.5 (3) MG/3ML SOLN   Nebulization   Take 3 mLs by nebulization 2 (two) times daily.          . Multiple Vitamin (MULTIVITAMIN) tablet   Oral   Take 1 tablet by mouth daily.          BP 104/44  Pulse 77  Temp(Src) 97.9 F (36.6 C) (Oral)  Resp 19  Ht 6\' 1"  (1.854 m)  Wt 238 lb (107.956 kg)  BMI 31.41 kg/m2  SpO2  100% Physical Exam  Nursing note and vitals reviewed. Constitutional: He appears well-developed and well-nourished. No distress.  HENT:  Head: Normocephalic and atraumatic.  Mouth/Throat: No oropharyngeal exudate.  MM appear dehydrated  Eyes: Conjunctivae and EOM are normal. Pupils are equal, round, and reactive to light. Right eye exhibits no discharge. Left eye exhibits no discharge. No scleral icterus.  Neck: Normal range of motion. Neck supple. No JVD present. No thyromegaly present.  Cardiovascular: Normal heart sounds and intact distal pulses.  Exam reveals no gallop and no friction rub.   No murmur heard. Rapid heart rate, occasional pause, normal pulses at bilateral radial arteries, normal cap refill, no JVD  Pulmonary/Chest: Effort normal and breath sounds normal. No respiratory distress. He has no wheezes. He has no rales.  Abdominal: Soft. Bowel sounds are normal. He exhibits no distension and no mass. There is no tenderness.  Musculoskeletal: Normal range of motion. He exhibits edema ( scant bilateral LE edema). He exhibits no tenderness.  Lymphadenopathy:    He has no cervical adenopathy.  Neurological: He is alert. Coordination normal.  Skin: Skin is warm and dry. No rash noted. No erythema.  Psychiatric: He has a normal mood and affect. His behavior is normal.    ED Course  Procedures (including critical care time) Labs Review Labs Reviewed  CBC - Abnormal; Notable for the following:    RBC 4.06 (*)    Hemoglobin 11.3 (*)    HCT 35.0 (*)    RDW 17.5 (*)    All other components within normal limits  COMPREHENSIVE METABOLIC PANEL - Abnormal; Notable for the following:    Sodium 134 (*)    Chloride 92 (*)    Glucose, Bld 261 (*)    BUN 25 (*)    Creatinine, Ser 1.54 (*)    Albumin 3.4 (*)    Total Bilirubin 0.2 (*)    GFR calc non Af Amer 41 (*)    GFR calc Af Amer 47 (*)    All other components within normal limits  TROPONIN I - Abnormal; Notable for the  following:    Troponin I 0.84 (*)    All other components within normal limits  APTT  PROTIME-INR   Imaging Review Dg Chest Portable 1 View  06/17/2013  CLINICAL DATA:  Chest pain  EXAM: PORTABLE CHEST - 1 VIEW  COMPARISON:  DG CHEST 1V PORT dated 06/01/2012  FINDINGS: There is no focal parenchymal opacity, pleural effusion, or pneumothorax. Stable cardiomegaly. The osseous structures are unremarkable.  IMPRESSION: No active disease.   Electronically Signed   By: Elige KoHetal  Patel   On: 06/17/2013 01:46     EKG Interpretation   Date/Time:  Tuesday June 17 2013 01:10:48 EDT Ventricular Rate:  140 PR Interval:    QRS Duration: 122 QT Interval:  342 QTC Calculation: 522 R Axis:   98 Text Interpretation:  Undetermined rhythm Rightward axis Anterior infarct  , age undetermined Marked ST abnormality, possible inferior subendocardial  injury Abnormal ECG Since last tracing rate faster st abnormality is new  Confirmed by Greg Eckrich  MD, Carollynn Pennywell ([id number]) on 06/17/2013 1:17:49 AM      ED ECG REPORT  I personally interpreted this EKG   Date: 06/17/2013   Rate: 99  Rhythm: atrial flutter with variable conduction  QRS Axis: right  Intervals: normal  ST/T Wave abnormalities: nonspecific ST/T changes  Conduction Disutrbances:nonspecific intraventricular conduction delay  Narrative Interpretation:   Old EKG Reviewed: changes noted - see below in MDM    MDM   Final diagnoses:  NSTEMI (non-ST elevated myocardial infarction)  Atrial fibrillation with rapid ventricular response    The pt has ECG showing a regular tachycardia with occasaional pause or dropped beat - this is regular and not c/w afib - would consider flutter with 2:1 block or SVT or Stach.  There is ST depressions in multiple leads.  Cardizem 20mg  IV bolus was given and the ECG changed to show improvement in the ST findings though still abnormal.  The new rhythm appears to show either Stach with dropped beats every 3rd beat or a  aflutter with variable conduction but does appear to be in regular pattern.  Will start drip, investigate for ischemic source of pt's pain.  He is pain free at this time.  CXR, labs.  Labs show that the pt does not have a signficant anemia - Hgb of 11.3, normal WBC and normal platelets.  coags normal,   CMP shows improving renal function with Cr of 1.5 compared with prior admission this is improving.  He has normal electrolytes othewise.    Troponin is elevated consistent with a NSTEMI - he has had this recently as well however this is likely a new event given his recent recurrent sx.  He has abnormal ECG and likely has suffered some demand ischemia given the rapid heart rate and known ischemic cardiomyopathy.  The pt has requested in prior charts and medical encounters that he does not want any acute interventions and is DNR - I have discussed the pt's care with Dr. Ronelle NighMaclean with cardiology - recommends local admit to monitor overnight - continue rate control and consult local cardiology - Dr. Diona BrownerMcDowell in the AM.  Will d/w hospitalist.  Heparin ordered.  cardizem gtt ordered.  D/w Dr. Sharl MaLama - will admit  CRITICAL CARE Performed by: Vida RollerBrian D Curties Conigliaro Total critical care time: 35 Critical care time was exclusive of separately billable procedures and treating other patients. Critical care was necessary to treat or prevent imminent or life-threatening deterioration. Critical care was time spent personally by me on the following activities: development of treatment plan with patient and/or surrogate as well as nursing, discussions with consultants, evaluation of patient's response to treatment, examination of patient, obtaining history from patient or surrogate, ordering and  performing treatments and interventions, ordering and review of laboratory studies, ordering and review of radiographic studies, pulse oximetry and re-evaluation of patient's condition.  Meds given in ED:  Medications  nitroGLYCERIN  (NITROSTAT) SL tablet 0.4 mg (not administered)  heparin ADULT infusion 100 units/mL (25000 units/250 mL) (not administered)  diltiazem (CARDIZEM) injection 20 mg (0 mg Intravenous Stopped 06/17/13 0141)  diltiazem (CARDIZEM) 25 MG/5ML injection (  Duplicate 06/17/13 0126)  diltiazem (CARDIZEM) 100 mg in dextrose 5 % 100 mL infusion (5 mg/hr Intravenous New Bag/Given 06/17/13 0141)  heparin bolus via infusion 4,000 Units (4,000 Units Intravenous New Bag/Given 06/17/13 0251)      Vida Roller, MD 06/17/13 [phone]

## 2013-06-17 NOTE — Progress Notes (Signed)
*  PRELIMINARY RESULTS* Echocardiogram 2D Echocardiogram has been performed.  [name] 06/17/2013, 12:02 PM

## 2013-06-17 NOTE — Consult Note (Signed)
WOC wound consult note Reason for Consult:Suspected Stage III ulcer between buttocks. Has been present for several months. Patient with prophylactic silicone foam dressing to the sacrum; there is no skin abnormality noted beneath this dressing.  Bilateral LEs with erythema and scattered open and scabbed areas, no open wounds.  MD has ordered elevation and I agree with this intervention. Wound type: Not pressure, suspect dermatologic abnormality perhaps benign nevus or neoplasm Pressure Ulcer POA: No Measurement:1.4cm x .8cm raised to 0.4cm (no depth). Blanchable erythema measuring 2cm x 3cm in the periwound. Wound bed: Area is vascular and has darker pigment, patient states that this area is painful and has bled on occasion.   Drainage (amount, consistency, odor) No drainage. Periwound:As above. Dressing procedure/placement/frequency: I will suggest twice daily and PRN skin care to he overall perineal area, buttocks and to the identified lesion at the apex of the gluteal cleft (not a pressure ulcer). Patient wears adult containment briefs for urinary incontinence and he is encouraged to go without these in acute care; I assured him of our commitment to timely incontinence care, regular toileting and limitless linen products which are actually good for his skin (ie.e, DermaTherapy).  I recommend consideration of a referral to Dermatology post discharge for biopsy and removal of this lesion.  If you agree, please order or include in your discharge instructions for the [facility]. WOC nursing team will not follow, but will remain available to this patient, the nursing and medical teams.  Please re-consult if needed. Thanks, Ladona MowLaurie Teesha Ohm, MSN, RN, GNP, [place], CWON-AP [phone])

## 2013-06-17 NOTE — Care Management Note (Addendum)
    Page 1 of 1   06/20/2013     11:17:41 AM CARE MANAGEMENT NOTE 06/20/2013  Patient:  [name],[name]   Account Number:  [id number]  Date Initiated:  06/17/2013  Documentation initiated by:  Sharrie RothmanBLACKWELL,Syra Sirmons C  Subjective/Objective Assessment:   [facility]. Pt will return to facility at discharge.     Action/Plan:   CSW to arrange discharge to facility when medically stable.   Anticipated DC Date:  06/20/2013   Anticipated DC Plan:  SKILLED NURSING FACILITY  In-house referral  Clinical Social Worker      DC Planning Services  CM consult      Choice offered to / List presented to:             Status of service:  Completed, signed off Medicare Important Message given?  YES (If response is "NO", the following Medicare IM given date fields will be blank) Date Medicare IM given:  06/20/2013 Date Additional Medicare IM given:    Discharge Disposition:  SKILLED NURSING FACILITY  Per UR Regulation:    If discussed at Long Length of Stay Meetings, dates discussed:    Comments:  06/20/13 1115 Arlyss Queenammy Debbie Bellucci, RN BSN CM Pt [facility] [place]. CSW to arrange discharge to facility.  06/17/13 1300 Arlyss Queenammy Jaima Janney, RN BSN CM

## 2013-06-17 NOTE — Clinical Social Work Psychosocial (Signed)
    Clinical Social Work Department BRIEF PSYCHOSOCIAL ASSESSMENT 06/17/2013  Patient:  [name],[name]     Account Number:  [id number]     Admit date:  06/17/2013  Clinical Social Worker:  [name],[name], CLINICAL SOCIAL WORKER  Date/Time:  06/17/2013 12:30 PM  Referred by:  CSW  Date Referred:  06/17/2013 Referred for  SNF Placement   Other Referral:   Interview type:  Patient Other interview type:    PSYCHOSOCIAL DATA Living Status:  FACILITY Admitted from facility[facility]  [facility] [place] Level of care:  Skilled Nursing Facility Primary support name:  [name], [name] Primary support relationship to patient:  NONE Degree of support available:   Has no family who visits at [facility], [name] [name]), care coordinator [facility], has been v supportive and involved w him.  [name] is his long time friend [phone])    CURRENT CONCERNS Current Concerns  Post-Acute Placement   Other Concerns:    SOCIAL WORK ASSESSMENT / PLAN CSW spoke briefly w patient at bedside, patient appears tired but oriented and able to communicate w CSW.  Richard DavidsonFriend, [name] at bedside.  Patient confirms that he does not want [name] on facesheet.  Asks for [name] and [name] to be added instead.  [facility].  Says he has been there approx one year.    CSW spoke w [name], admissions at [facility] [place]. Says patient has been with them "forever."  Is there on Medicaid at present, is not receiving any therapies. "Cannot walk good" but is able to "wheel himself in a wheelchair all over the facility."  Can use cane to walk occasionally, is able to get self from bed to commode or bathroom.  Completes his other ADLs (feeding and dressing) without assistance.  Facility helps w bathing.    [name], facility staff, has been very involved w patient.  Says she took him out for his monthly haircut last week and noted that he  had increasing difficulty walking - needed cane in one hand and her support in other. Also has noted his legs swelling more than in the past. Says she has tried to encourage him to seek care earlier but patient resisted coming to the ED.    Per facility, family has not visited at facility.  Contact listed on facesheet ([name]) is not daughter nor POA. [name] has never visited patient at [facility], is daughter of long time girlfriend who is now deceased.  Does have friend [name]([phone]) who visited regularly and is [facility].  Patient has brother, but facility has been told not to contact him by patient.    CSW will address issues w contacts on facesheet and facilitate discharge planning as appropriate.   Assessment/plan status:  Psychosocial Support/Ongoing Assessment of Needs Other assessment/ plan:   Information/referral to community resources:   [facility], no further information needed.    PATIENT'S/FAMILY'S RESPONSE TO PLAN OF CARE: Patient requested change of contacts on facesheet.  [facility] and wants to return.  Grateful for support from friend at bedside.        Richard GeneraAnne Kia Varnadore, LCSW Clinical Social Worker [phone])

## 2013-06-17 NOTE — ED Notes (Signed)
[facility] complaining of chest pain off and on x 3 days. Per EMS, staff gave nitro SL x 3 with relief of pain. Patient states he is not having any pain at this time.

## 2013-06-17 NOTE — Progress Notes (Signed)
UR chart review completed.  

## 2013-06-17 NOTE — Progress Notes (Addendum)
[facility] PROGRESS NOTE  Richard BooneSamuel L [name] NUU:[id number] DOB: 02-24-1931 DOA: 06/17/2013 PCP: [name],ASHISH, MD  Brief Summary  80 year old male who has a past medical history of Essential hypertension, benign; Type 2 diabetes mellitus; Coronary atherosclerosis of native coronary artery; History of GI diverticular bleed; History of gastric ulcer; History of skin cancer; Permanent atrial fibrillation; NSTEMI (non-ST elevated myocardial infarction); Ischemic cardiomyopathy; Orthostatic hypotension; Acute kidney failure; and Anemia.  Patient p/w intermittent chest pain, found to have NSTEMI.  He has declined catheterization repeatedly.  Cardiology medically managing.     Assessment/Plan  Non-STEMI/Acute coronary syndrome, chest pain currently resolved. -  Continue heparin  -  Increase to full dose aspirin -  Defer addition of plavix to cardiology if indicated -  Add atorvastatin  -  Patient not on beta blocker secondary to low BP while on dilt for a-fib -  Continue prn NTG -  Tele:  A-fib, rate controlled -  Cardiology consultation -  Repeat ECG this AM   Atrial flutter with variable conduction/a-fib, rate controlled -  Continue cardizem gtt for now pending cardiology consultation -  Continue amiodarone  -  Aspirin for long-term stroke prevention despite high CHADS score secondary to GIB  ICM, unknown EF -  ECHO -  Judicious use of IVF -  Continue lasix 40mg  daily   HTN, blood pressure low normal  Diabetes mellitus, normally on lantus 60 units AM and 15 units HS, CBG well controlled -  D/c evening lantus -  Increase SSI to moderate dose and add HS insulin  COPD, stable.  continue duonebs BID with albuterol prn  Bleeding ulcers and hx of diverticular bleed, no active bleeding.  Last bleed about 2 years ago and did require blood trnasfusions -  Consider bleeding risk when choosing therapies  AKI vs. CKD -  Trend creatinine  Chronic anemia, likely due ot chronic disease  and iron deficiency -  Defer to PCP as this work up is likely already complete, also tests may be affected by acute illness -  Transfuse for hgb < 7 or evidence of hemodynamic instability and active bleeding  Stasis dermatitis vs. Cellulitis of bilateral lower extremities -  Elevate legs -  Compression stockings -  Diuresis as tolerated -  Start doxycycline  Ulcer, stage 3, in gluteal crease, present at admission and for over last two months -  Wound care consultation  Diet:  Healthy heart Access:  PIV IVF:  Off Proph:  Heparin  Code Status: DO NOT RESUSCITATE Family Communication: spoke with patient alone Disposition Plan: pending improvee   Consultants:  Cardiology  Procedures:  Echocardiogram  Chest x-ray  Antibiotics:  None   HPI/Subjective:  Denies active chest pain. His episodes of chest pain were associated with shortness of breath and diaphoresis. Resolves overnight last night. Have been happening intermittently since Friday. Occur at rest and with exertion.  Objective: Filed Vitals:   06/17/13 0515 06/17/13 0530 06/17/13 0545 06/17/13 0747  BP: 118/62 113/57 106/64   Pulse: 74 74 74   Temp:    97.9 F (36.6 C)  TempSrc:    Oral  Resp: 17 16 20    Height:      Weight:      SpO2: 98% 96% 97%     Intake/Output Summary (Last 24 hours) at 06/17/13 0800 Last data filed at 06/17/13 [id number]  Gross per 24 hour  Intake      0 ml  Output    250 ml  Net   -  250 ml   Filed Weights   06/17/13 0107 06/17/13 0500  Weight: 107.956 kg (238 lb) 107.3 kg (236 lb 8.9 oz)    Exam:   General:  Caucasian male, No acute distress  HEENT:  NCAT, MMM  Cardiovascular:  IRRR, nl S1, S2 no mrg, 2+ pulses, warm extremities  Respiratory:  CTAB, no increased WOB  Abdomen:   NABS, soft, NT/ND  MSK:   Normal tone and bulk, 2+ LEE  Neuro:  Grossly intact  Skin: Erythema of the bilateral ankles with some honey crusting of open ulcers.  Sacrum appears clean, dry, and  no stage I ulcers. Intergluteal crease, on the right buttock, there is an approximately 1 cm stage III ulcer.  Data Reviewed: Basic Metabolic Panel:  Recent Labs Lab 06/17/13 0145 06/17/13 0552  NA 134* 136*  K 4.5 4.1  CL 92* 95*  CO2 29 29  GLUCOSE 261* 198*  BUN 25* 24*  CREATININE 1.54* 1.43*  CALCIUM 9.5 9.2   Liver Function Tests:  Recent Labs Lab 06/17/13 0145 06/17/13 0552  AST 31 17  ALT 12 10  ALKPHOS 73 71  BILITOT 0.2* 0.3  PROT 7.6 7.1  ALBUMIN 3.4* 3.3*   No results found for this basename: LIPASE, AMYLASE,  in the last 168 hours No results found for this basename: AMMONIA,  in the last 168 hours CBC:  Recent Labs Lab 06/17/13 0145 06/17/13 0552  WBC 7.4 7.5  HGB 11.3* 10.9*  HCT 35.0* 34.0*  MCV 86.2 85.9  PLT 191 175   Cardiac Enzymes:  Recent Labs Lab 06/17/13 0145 06/17/13 0552  TROPONINI 0.84* 0.94*   BNP (last 3 results) No results found for this basename: PROBNP,  in the last 8760 hours CBG:  Recent Labs Lab 06/17/13 0725  GLUCAP 176*    No results found for this or any previous visit (from the past 240 hour(s)).   Studies: Dg Chest Portable 1 View  06/17/2013   CLINICAL DATA:  Chest pain  EXAM: PORTABLE CHEST - 1 VIEW  COMPARISON:  DG CHEST 1V PORT dated 06/01/2012  FINDINGS: There is no focal parenchymal opacity, pleural effusion, or pneumothorax. Stable cardiomegaly. The osseous structures are unremarkable.  IMPRESSION: No active disease.   Electronically Signed   By: Elige KoHetal  Patel   On: 06/17/2013 01:46    Scheduled Meds: . amiodarone  200 mg Oral Daily  . antiseptic oral rinse  15 mL Mouth Rinse BID  . aspirin EC  81 mg Oral Daily  . furosemide  40 mg Oral Daily  . insulin aspart  0-9 Units Subcutaneous TID WC  . insulin glargine  60 Units Subcutaneous BID  . ipratropium-albuterol  3 mL Nebulization BID  . sodium chloride  3 mL Intravenous Q12H   Continuous Infusions: . diltiazem (CARDIZEM) infusion    . heparin       Active Problems:   Cardiomyopathy, ischemic   CKD (chronic kidney disease) stage 3, GFR 30-59 ml/min   Atrial fibrillation   Carotid artery disease   NSTEMI (non-ST elevated myocardial infarction)   ACS (acute coronary syndrome)    Time spent: 30 min    Renae FickleMackenzie Dev Dhondt  [facility] Pager [phone]. If 7PM-7AM, please contact night-coverage at [email or web address], password [email or web address] 06/17/2013, 8:00 AM  LOS: 0 days

## 2013-06-17 NOTE — Consult Note (Addendum)
CARDIOLOGY CONSULT NOTE   Patient ID: [name] MRN: [id number] DOB/AGE: 10-Dec-1930 80 y.o.  Admit Date: 06/17/2013 Referring Physician: PTH-Dr. Malachi BondsShort MD Primary Physician: [name],ASHISH, MD Consulting Cardiologist: Nona DellMcDowell, Lyall MD Primary Cardiologist: Nona DellMcDowell, Damean [facility] [facility] Reason for Consultation: NSTEMI with history of ICM  Clinical Summary [name] is an 80 y.o.male admitted with chest pain, intermittent since Friday without obvious precipitant. He has known history of presumed ICM, atrial fibrillation, (not on anticoagulation due to GI bleed), LVEF 30-35% - managed conservatively, carotid artery disease and renal insufficiency. He resides at assisted living facility. He was found to have positive cardiac markers with troponin 0.84; 0.94 respectively.    He states that he has been having intermittent chest tightness for 4 days prior to coming to ER. He took NTG with some relief but pain returned about on hour later. Pain occurred at rest. He is very sedentary using a wheelchair for ambulation.He has also noticed some shortness of breath and LEE with weeping prior to coming to hospital.       On arrival to ER he was mildly hypertensive, with BP of 140/92, HR of 152 bpm. CXR negative for CHF. EKG atrial fib and marked ST depression in the inferior/lateral leads. He was treated with diltiazem bolus and then started on a gtt, heparin bolus with IV infusion. He was started on IV lasix . Dr. Shirlee LatchMcLean [facility] who [facility]. The patient refuses invasive testing. He is not on ACE inhibitor or ARB in the setting of acute renal failure in the past, and propensity for hyperkalemia.       He is feeling better this am without recurrent chest pain. Legs are no longer weeping, but redness and evidence of probable cellulitis is noted. Being treated with doxycycline.      No Known Allergies  Medications Scheduled Medications: . amiodarone  200 mg  Oral Daily  . antiseptic oral rinse  15 mL Mouth Rinse BID  . aspirin EC  325 mg Oral Daily  . atorvastatin  80 mg Oral q1800  . doxycycline  100 mg Oral Q12H  . furosemide  40 mg Oral Daily  . heparin subcutaneous  5,000 Units Subcutaneous 3 times per day  . insulin aspart  0-15 Units Subcutaneous TID WC  . insulin aspart  0-5 Units Subcutaneous QHS  . insulin glargine  60 Units Subcutaneous Daily  . ipratropium-albuterol  3 mL Nebulization BID  . isosorbide mononitrate  30 mg Oral Daily  . metoprolol tartrate  25 mg Oral BID  . sodium chloride  3 mL Intravenous Q12H    PRN Medications: sodium chloride, acetaminophen, albuterol, morphine injection, nitroGLYCERIN, ondansetron (ZOFRAN) IV, ondansetron, sodium chloride   Past Medical History  Diagnosis Date  . Essential hypertension, benign   . Type 2 diabetes mellitus   . Coronary atherosclerosis of native coronary artery     Presumed and managed conservatively  . History of GI diverticular bleed   . History of gastric ulcer   . History of skin cancer   . Permanent atrial fibrillation   . NSTEMI (non-ST elevated myocardial infarction)   . Ischemic cardiomyopathy     LVEF 30-35%  . Orthostatic hypotension   . Acute kidney failure   . Anemia     Past Surgical History  Procedure Laterality Date  . Abdominal surgery      Family History  Problem Relation Age of Onset  . Hypertension Father  Social History [name] reports that he quit smoking about 26 years ago. He does not have any smokeless tobacco history on file. [name] reports that he does not drink alcohol.  Review of Systems Otherwise reviewed and negative except as outlined.  Physical Examination Blood pressure 126/47, pulse 78, temperature 97.9 F (36.6 C), temperature source Oral, resp. rate 20, height 6\' 1"  (1.854 m), weight 236 lb 8.9 oz (107.3 kg), SpO2 100.00%.  Intake/Output Summary (Last 24 hours) at 06/17/13 1024 Last data filed at  06/17/13 0900  Gross per 24 hour  Intake    495 ml  Output    250 ml  Net    245 ml    Telemetry: Atrial fibrillation rate of 78 bpm.  GEN: No acute distress or complaints of chest pain. HEENT: Conjunctiva and lids normal, oropharynx clear with moist mucosa. Neck: Supple, no elevated JVP or carotid bruits, no thyromegaly. Lungs: Clear in the apex but diminished in the bases. No wheezes. Cardiac: Distant heart sounds, Iregular rate and rhythm, soft systolic murmur, no pericardial rub. Abdomen: Soft, nontender, bowel sounds present, no guarding or rebound. Slightly distended. Extremities: 1+ pitting edema, distal pulses 1+. With erythema bilateral pre-tibial areas.  Skin: Warm and dry. Musculoskeletal: No kyphosis. Neuropsychiatric: Alert and oriented x3, affect grossly appropriate.Hard of hearing.   Prior Cardiac Testing/Procedures 1. Echocardiogram: 05/30/2012 The LV chamber size is upper normal. Mild concentric left ventricular hypertrophy is observed. There is global hypokinesis of the left ventricle with minor regional variation. There is moderate to severely decreased left ventricular systolic function. LV systolic function and 30-35%, with septal flattening of the intraventricular septum consistent with right ventricular volume or pressure overload. The LV diastolic filling pattern is consistent with pseudonormalization, the basal anteroseptal, mid anterior septal, and anterior wall segments are akinetic. The left atrium is moderately dilated.  The mitral valve leaflets are moderately thickened, there is moderate mitral regurg. Aortic valve is trileaflet. Aortic valve is trileaflet. The aortic valve leaflets are mildly thickened. Moderate aortic leaflet calcification is visualized. There is aortic annular calcification. There is trace of aortic regurg. There is least moderate aortic stenosis. Mean gradient of the aortic valve is 6 mmHg-also could be low gradient stenosis with reduced  LVEF. The aortic valve area, but peak velocities, is calculated at 1.53 cm square. Aortic valve area, by VTI is calculated at 1.04 cm square. The aortic valve area, by planimetryis calculated at 1 cm square. The right ventricle is moderately to severely dilated. The right ventricular global systolic function is severely reduced. Right atrium is mildly dilated. There is moderate tricuspid regurg. The right ventricular systolic pressure is calculated at 47 mm mercury. There is evidence of moderate pulmonary hypertension. CVP estimated at 20 mm of mercury. There is a small pericardial effusion.   Lab Results  Basic Metabolic Panel:  Recent Labs Lab 06/17/13 0145 06/17/13 0552  NA 134* 136*  K 4.5 4.1  CL 92* 95*  CO2 29 29  GLUCOSE 261* 198*  BUN 25* 24*  CREATININE 1.54* 1.43*  CALCIUM 9.5 9.2    Liver Function Tests:  Recent Labs Lab 06/17/13 0145 06/17/13 0552  AST 31 17  ALT 12 10  ALKPHOS 73 71  BILITOT 0.2* 0.3  PROT 7.6 7.1  ALBUMIN 3.4* 3.3*    CBC:  Recent Labs Lab 06/17/13 0145 06/17/13 0552  WBC 7.4 7.5  HGB 11.3* 10.9*  HCT 35.0* 34.0*  MCV 86.2 85.9  PLT 191 175  Cardiac Enzymes:  Recent Labs Lab 06/17/13 0145 06/17/13 0552  TROPONINI 0.84* 0.94*    Radiology: Dg Chest Portable 1 View  06/17/2013   CLINICAL DATA:  Chest pain  EXAM: PORTABLE CHEST - 1 VIEW  COMPARISON:  DG CHEST 1V PORT dated 06/01/2012  FINDINGS: There is no focal parenchymal opacity, pleural effusion, or pneumothorax. Stable cardiomegaly. The osseous structures are unremarkable.  IMPRESSION: No active disease.   Electronically Signed   By: Elige Ko   On: 06/17/2013 01:46    ECG: Atrial fibrillation with ST depression in the inferior and lateral leads.    Impression and Recommendations  1. NSTEMI: Presented to the emergency in atrial fibrillation with RVR with marked ST depression in inferior lateral leads with positive cardiac markers. Subsequent conversion to an  ectopic atrial rhythm in which he remains this morning. The patient refuses invasive testing. Currently pain free. He is a patient at the skilled nursing facility, and is compliant with medications.      Due to history of GI bleed, would not continue IV heparin. Change to VTE prophylaxis only. Continue enteric-coated aspirin, statin. Would not begin ACE inhibitor or ARB in the setting of renal insufficiency. We will repeat echocardiogram as one has not been completed a year to assist with medical management. Add isosorbide if blood pressure tolerates.  2. Atrial fibrillation with RVR: We will start low-dose metoprolol. We will transition to by mouth, continue amiodarone. This may be related to acute illness with cellulitis of the lower extremities, vs. ischemia with non-ST elevation MI. Echocardiogram as stated has been ordered. Would not place on any anticoagulation due to history of GI bleed. Continue aspirin.  3. Systolic Dysfunction: Last Echo in 3.2014 EF of 30-35%.  He is currently on by mouth Lasix. Home dose is 40 mg daily. He is positive concerning I/0. Creatinine is 1.43 this a.m. Now that heart rate is better controlled, expect better diuresis. May need to increase dose however with evidence of mild fluid overload. Chest x-ray is negative for CHF however.  4. Aortic stenosis: Moderate to severe, possibly low gradient stenosis by prior testing. Followup echocardiogram pending.   Signed: Bettey Mare. Lyman Bishop NP [facility] 06/17/2013, 10:24 AM Co-Sign MD   Attending note:  Patient seen and examined. Reviewed records and modified above note by Ms. Lawrence NP. [name] is known to me, last seen in the office in November 2014. He has been managed conservatively at his request (declines any invasive testing) for probable ischemic cardiomyopathy, PAF, and aortic stenosis. Presents from nursing home with recurring chest pain over the last 4 days, noted to be in rapid atrial  fibrillation at presentation. He has spontaneously converted to an ectopic atrial rhythm and feels better. Cardiac markers are consistent with NSTEMI and ECG also shows fairly diffuse ST segment depression, typically with rapid rates. He has a history of GI bleeding and is therefore not anticoagulated with PAF. Plan at this point would be conservative treatment as before, modifying medications as able. He is currently on aspirin, amiodarone (increase to BID for now), Lipitor, agree with adding Lopressor and trial of Imdur. Followup echocardiogram pending, expect progressive disease, however may help guide medication choices.  Jonelle Sidle, M.D., F.A.C.C.

## 2013-06-17 NOTE — H&P (Addendum)
PCP:   Monico Blitz, MD   Chief Complaint:  Chest pain  HPI:  80 year old male who  has a past medical history of Essential hypertension, benign; Type 2 diabetes mellitus; Coronary atherosclerosis of native coronary artery; History of GI diverticular bleed; History of gastric ulcer; History of skin cancer; Permanent atrial fibrillation; NSTEMI (non-ST elevated myocardial infarction); Ischemic cardiomyopathy; Orthostatic hypotension; Acute kidney failure; and Anemia. Represents to the ED with chief complaint of chest pain intermittent over the past few days. Patient is currently residing at [facility], he was admitted recently to [place] 3 weeks ago with uncontrolled atrial fibrillation, anemia, pneumonia, non-STEMI. At that time patient was managed conservatively with no intake ablation due to history of GI bleed. Patient has been followed by Dr. Gwen Her as outpatient, and has been managed conservatively for ischemic cardiomyopathy. The pain he describes is substernal, does not radiate, associated with some shortness of breath. He denies chest pain at this time, no nausea vomiting or diarrhea. In the ED patient was found to have elevated troponin 0.84, ED physician called and spoke to cardiology Dr. Aundra Dubin at [facility], who recommended conservative management.  Allergies:  No Known Allergies    Past Medical History  Diagnosis Date  . Essential hypertension, benign   . Type 2 diabetes mellitus   . Coronary atherosclerosis of native coronary artery     Presumed and managed conservatively  . History of GI diverticular bleed   . History of gastric ulcer   . History of skin cancer   . Permanent atrial fibrillation   . NSTEMI (non-ST elevated myocardial infarction)   . Ischemic cardiomyopathy     LVEF 30-35%  . Orthostatic hypotension   . Acute kidney failure   . Anemia     Past Surgical History  Procedure Laterality Date  . Abdominal surgery      Prior to  Admission medications   Medication Sig Start Date End Date Taking? Authorizing Provider  traMADol (ULTRAM) 50 MG tablet Take by mouth 3 (three) times daily as needed.   Yes Historical Provider, MD  acetaminophen (TYLENOL) 650 MG CR tablet Take 650 mg by mouth every 8 (eight) hours as needed for pain.    Historical Provider, MD  albuterol (PROVENTIL) (2.5 MG/3ML) 0.083% nebulizer solution Take 2.5 mg by nebulization every 6 (six) hours as needed for wheezing.    Historical Provider, MD  amiodarone (PACERONE) 200 MG tablet Take 200 mg by mouth daily.    Historical Provider, MD  aspirin 81 MG tablet Take 81 mg by mouth daily.    Historical Provider, MD  cyanocobalamin (,VITAMIN B-12,) 1000 MCG/ML injection as directed. 05/06/12   Historical Provider, MD  furosemide (LASIX) 20 MG tablet Take 40 mg by mouth daily.     Historical Provider, MD  insulin aspart (NOVOLOG) 100 UNIT/ML injection Inject 10 Units into the skin daily. Sliding scale    Historical Provider, MD  insulin glargine (LANTUS) 100 UNIT/ML injection Inject 60 Units into the skin 2 (two) times daily. 60 units SQ QD at 0800 and 15 units SQ HS at 2000.    Historical Provider, MD  ipratropium-albuterol (DUONEB) 0.5-2.5 (3) MG/3ML SOLN Take 3 mLs by nebulization 2 (two) times daily.     Historical Provider, MD  Multiple Vitamin (MULTIVITAMIN) tablet Take 1 tablet by mouth daily.    Historical Provider, MD    Social History:  reports that he quit smoking about 26 years ago. He does not have any smokeless  tobacco history on file. He reports that he does not drink alcohol or use illicit drugs.   All the positives are listed in BOLD  Review of Systems:  HEENT: Headache, blurred vision, runny nose, sore throat Neck: Hypothyroidism, hyperthyroidism,,lymphadenopathy Chest : Shortness of breath, history of COPD, Asthma Heart : Chest pain, history of coronary arterey disease GI:  Nausea, vomiting, diarrhea, constipation, GERD GU: Dysuria,  urgency, frequency of urination, hematuria Neuro: Stroke, seizures, syncope Psych: Depression, anxiety, hallucinations   Physical Exam: Blood pressure 106/84, pulse 74, temperature 97.9 F (36.6 C), temperature source Oral, resp. rate 18, height $RemoveBe'6\' 1"'SHBqmNmex$  (1.854 m), weight 107.956 kg (238 lb), SpO2 100.00%. Constitutional:   Patient is a well-developed and well-nourished male* in no acute distress and cooperative with exam. Head: Normocephalic and atraumatic Mouth: Mucus membranes moist Eyes: PERRL, EOMI, conjunctivae normal Neck: Supple, No Thyromegaly Cardiovascular: RRR, S1 normal, S2 normal Pulmonary/Chest: CTAB, no wheezes, rales, or rhonchi Abdominal: Soft. Non-tender, non-distended, bowel sounds are normal, no masses, organomegaly, or guarding present.  Neurological: A&O x3, Strenght is normal and symmetric bilaterally, cranial nerve II-XII are grossly intact, no focal motor deficit, sensory intact to light touch bilaterally.  Extremities : Bilateral 2+ pitting edema, erythema noted in the lower extremities, some open areas noted in the left leg   Labs on Admission:  Results for orders placed during the hospital encounter of 06/17/13 (from the past 48 hour(s))  APTT     Status: None   Collection Time    06/17/13  1:45 AM      Result Value Ref Range   aPTT 33  24 - 37 seconds  CBC     Status: Abnormal   Collection Time    06/17/13  1:45 AM      Result Value Ref Range   WBC 7.4  4.0 - 10.5 K/uL   RBC 4.06 (*) 4.22 - 5.81 MIL/uL   Hemoglobin 11.3 (*) 13.0 - 17.0 g/dL   HCT 35.0 (*) 39.0 - 52.0 %   MCV 86.2  78.0 - 100.0 fL   MCH 27.8  26.0 - 34.0 pg   MCHC 32.3  30.0 - 36.0 g/dL   RDW 17.5 (*) 11.5 - 15.5 %   Platelets 191  150 - 400 K/uL  COMPREHENSIVE METABOLIC PANEL     Status: Abnormal   Collection Time    06/17/13  1:45 AM      Result Value Ref Range   Sodium 134 (*) 137 - 147 mEq/L   Potassium 4.5  3.7 - 5.3 mEq/L   Chloride 92 (*) 96 - 112 mEq/L   CO2 29  19 - 32  mEq/L   Glucose, Bld 261 (*) 70 - 99 mg/dL   BUN 25 (*) 6 - 23 mg/dL   Creatinine, Ser 1.54 (*) 0.50 - 1.35 mg/dL   Calcium 9.5  8.4 - 10.5 mg/dL   Total Protein 7.6  6.0 - 8.3 g/dL   Albumin 3.4 (*) 3.5 - 5.2 g/dL   AST 31  0 - 37 U/L   ALT 12  0 - 53 U/L   Alkaline Phosphatase 73  39 - 117 U/L   Total Bilirubin 0.2 (*) 0.3 - 1.2 mg/dL   GFR calc non Af Amer 41 (*) >90 mL/min   GFR calc Af Amer 47 (*) >90 mL/min   Comment: (NOTE)     The eGFR has been calculated using the CKD EPI equation.     This calculation has not been  validated in all clinical situations.     eGFR's persistently <90 mL/min signify possible Chronic Kidney     Disease.  PROTIME-INR     Status: None   Collection Time    06/17/13  1:45 AM      Result Value Ref Range   Prothrombin Time 13.8  11.6 - 15.2 seconds   INR 1.08  0.00 - 1.49  TROPONIN I     Status: Abnormal   Collection Time    06/17/13  1:45 AM      Result Value Ref Range   Troponin I 0.84 (*) <0.30 ng/mL   Comment:            Due to the release kinetics of cTnI,     a negative result within the first hours     of the onset of symptoms does not rule out     myocardial infarction with certainty.     If myocardial infarction is still suspected,     repeat the test at appropriate intervals.     CRITICAL RESULT CALLED TO, READ BACK BY AND VERIFIED WITH:     SHORE L AT 0216 ON [id number] BY FORSYTH K    Radiological Exams on Admission: Dg Chest Portable 1 View  06/17/2013   CLINICAL DATA:  Chest pain  EXAM: PORTABLE CHEST - 1 VIEW  COMPARISON:  DG CHEST 1V PORT dated 06/01/2012  FINDINGS: There is no focal parenchymal opacity, pleural effusion, or pneumothorax. Stable cardiomegaly. The osseous structures are unremarkable.  IMPRESSION: No active disease.   Electronically Signed   By: Kathreen Devoid   On: 06/17/2013 01:46    Assessment/Plan Active Problems:   Cardiomyopathy, ischemic   CKD (chronic kidney disease) stage 3, GFR 30-59 ml/min   Atrial  fibrillation   Carotid artery disease   NSTEMI (non-ST elevated myocardial infarction)  Non-STEMI/Acute coronary syndrome Patient will be started on heparin infusion as per cardiology recommendation. Conservative management has been recommended in the past and also today as per discussion of Dr. Aundra Dubin cardiology with Dr. Noemi Chapel. We'll continue with aspirin. EKG shows ST depression in the anterior chest leads. Will consult cardiology in am.  Atrial flutter with variable conduction Patient has been started on IV Cardizem, we'll continue the Cardizem infusion. Patient was not started on anticoagulation in the past due to history of GI bleed. We'll continue with aspirin. Continue amiodarone 200 mg by mouth daily.  Diabetes mellitus Start sliding scale insulin, continue Lantus 60 units twice a day.  Code status: Patient is DO NOT RESUSCITATE  Family discussion: No family at bedside  Time Spent on Admission: 69 min  [facility] Pager: [phone] 06/17/2013, 3:54 AM  If 7PM-7AM, please contact night-coverage  [email or web address]  Password [email or web address]

## 2013-06-17 NOTE — Progress Notes (Signed)
Inpatient Diabetes Program Recommendations  AACE/ADA: New Consensus Statement on Inpatient Glycemic Control (2013)  Target Ranges:  Prepandial:   less than 140 mg/dL      Peak postprandial:   less than 180 mg/dL (1-2 hours)      Critically ill patients:  140 - 180 mg/dL   Results for [name], Lindberg L (MRN [id number]) as of 06/17/2013 07:53  Ref. Range 10/16/2010 07:30 10/16/2010 11:05 10/16/2010 16:50 10/16/2010 21:37 10/17/2010 07:45 10/17/2010 11:58 06/17/2013 07:25  Glucose-Capillary Latest Range: 70-99 mg/dL 409219 (H) 811324 (H) 914173 (H) 178 (H) 191 (H) 210 (H) 176 (H)   Diabetes history: DM2 Outpatient Diabetes medications: Lantus 15 units Q8AM, Lantus 60 units Q8PM, Novolog 0-12 units Q6AM, Novolog 0-12 units Q4PM, Novolog 10 units Q5:30PM Current orders for Inpatient glycemic control: Latnus 60 units BID, Novolog 0-9 units AC  Inpatient Diabetes Program Recommendations Insulin - Basal: Please consider changing Lantus order to 60 units in the am, Lantus 15 units QHS. HgbA1C: Please order an A1C to evaluate glycemic control over the past 2-3 months.  Note: Noted order for Lantus 60 units BID, however home medication list states Lantus 60 Q8am and Lantus 15 units Q8pm.  Asked patient to verify Lantus dose and patient is unsure of dose since [facility]  [facility] as noted above.  Please order an A1C and change Lantus order to 60 QAM and Lantus 15 units QHS.  Will continue to follow.  Thanks, Orlando PennerMarie Anjanette Gilkey, RN, MSN, CCRN Diabetes Coordinator Inpatient Diabetes Program [phone] (Team Pager) [phone] (AP office) [phone] [facility] office)

## 2013-06-18 DIAGNOSIS — I200 Unstable angina: Secondary | ICD-10-CM

## 2013-06-18 LAB — GLUCOSE, CAPILLARY
GLUCOSE-CAPILLARY: 183 mg/dL — AB (ref 70–99)
Glucose-Capillary: 143 mg/dL — ABNORMAL HIGH (ref 70–99)
Glucose-Capillary: 304 mg/dL — ABNORMAL HIGH (ref 70–99)
Glucose-Capillary: 255 mg/dL — ABNORMAL HIGH (ref 70–99)

## 2013-06-18 LAB — BASIC METABOLIC PANEL
BUN: 25 mg/dL — ABNORMAL HIGH (ref 6–23)
CO2: 28 mEq/L (ref 19–32)
Calcium: 9 mg/dL (ref 8.4–10.5)
Chloride: 98 mEq/L (ref 96–112)
Creatinine, Ser: 1.47 mg/dL — ABNORMAL HIGH (ref 0.50–1.35)
GFR, EST AFRICAN AMERICAN: 50 mL/min — AB (ref >90)
GFR, EST NON AFRICAN AMERICAN: 43 mL/min — AB (ref >90)
Glucose, Bld: 168 mg/dL — ABNORMAL HIGH (ref 70–99)
Potassium: 4.5 mEq/L (ref 3.7–5.3)
SODIUM: 138 meq/L (ref 137–147)

## 2013-06-18 NOTE — Progress Notes (Signed)
Inpatient Diabetes Program Recommendations  AACE/ADA: New Consensus Statement on Inpatient Glycemic Control (2013)  Target Ranges:  Prepandial:   less than 140 mg/dL      Peak postprandial:   less than 180 mg/dL (1-2 hours)      Critically ill patients:  140 - 180 mg/dL   Results for [name], [name] (MRN [id number]) as of 06/18/2013 09:55  Ref. Range 06/17/2013 07:25 06/17/2013 11:19 06/17/2013 16:04 06/17/2013 21:22 06/18/2013 07:21  Glucose-Capillary Latest Range: 70-99 mg/dL 782176 (H) 956312 (H) 213237 (H) 183 (H) 143 (H)   Diabetes history: DM2  Outpatient Diabetes medications: Lantus 15 units Q8AM, Lantus 60 units Q8PM, Novolog 0-12 units Q6AM, Novolog 0-12 units Q4PM, Novolog 10 units Q5:30PM  Current orders for Inpatient glycemic control: Latnus 60 units BID, Novolog 0-15 units AC, Novolog 0-5 units HS  Inpatient Diabetes Program Recommendations Insulin - Meal Coverage: Please consider ordering Novolog 4 units TID with meals for meal coverage. HgbA1C: Please order an A1C to evaluate glycemic control over the past 2-3 months.  Thanks, Orlando PennerMarie Aneesha Holloran, RN, MSN, CCRN Diabetes Coordinator Inpatient Diabetes Program [phone] (Team Pager) [phone] (AP office) [phone] [facility] office)

## 2013-06-18 NOTE — Progress Notes (Signed)
Primary cardiologist: Dr. Jonelle SidleSamuel G. Lunden [name]  Subjective:   No active chest pain, sitting up eating breakfast. Slept well.   Objective:   Temp:  [97.5 F (36.4 C)-98.5 F (36.9 C)] 97.9 F (36.6 C) (04/15 0732) Pulse Rate:  [49-78] 69 (04/15 0815) Resp:  [10-29] 20 (04/15 0815) BP: (97-134)/(39-84) 134/65 mmHg (04/15 0800) SpO2:  [91 %-100 %] 96 % (04/15 0815) Weight:  [238 lb 1.6 oz (108 kg)] 238 lb 1.6 oz (108 kg) (04/15 0500) Last BM Date: 06/16/13  Filed Weights   06/17/13 0107 06/17/13 0500 06/18/13 0500  Weight: 238 lb (107.956 kg) 236 lb 8.9 oz (107.3 kg) 238 lb 1.6 oz (108 kg)    Intake/Output Summary (Last 24 hours) at 06/18/13 0846 Last data filed at 06/17/13 2115  Gross per 24 hour  Intake   1085 ml  Output    250 ml  Net    835 ml    Telemetry: Currently ectopic atrial tachycardia versus atrial flutter.  Exam:  General: Chronically ill-appearing, no distress.  Lungs: Decreased breath sounds, nonlabored breathing.  Cardiac: RRR, indistinct PMI, 2/6 systolic murmur at the apex.  Abdomen: NABS.  Extremities: Mild edema, chronic appearing.   Lab Results:  Basic Metabolic Panel:  Recent Labs Lab 06/17/13 0145 06/17/13 0552 06/18/13 0446  NA 134* 136* 138  K 4.5 4.1 4.5  CL 92* 95* 98  CO2 29 29 28   GLUCOSE 261* 198* 168*  BUN 25* 24* 25*  CREATININE 1.54* 1.43* 1.47*  CALCIUM 9.5 9.2 9.0    Liver Function Tests:  Recent Labs Lab 06/17/13 0145 06/17/13 0552  AST 31 17  ALT 12 10  ALKPHOS 73 71  BILITOT 0.2* 0.3  PROT 7.6 7.1  ALBUMIN 3.4* 3.3*    CBC:  Recent Labs Lab 06/17/13 0145 06/17/13 0552  WBC 7.4 7.5  HGB 11.3* 10.9*  HCT 35.0* 34.0*  MCV 86.2 85.9  PLT 191 175    Cardiac Enzymes:  Recent Labs Lab 06/17/13 0552 06/17/13 1027 06/17/13 1722  TROPONINI 0.94* 0.79* 0.90*    ECG: Probable ectopic atrial rhythm with atrial bigeminy, diffuse nonspecific ST-T changes.   Medications:   Scheduled  Medications: . amiodarone  200 mg Oral BID  . antiseptic oral rinse  15 mL Mouth Rinse BID  . aspirin EC  325 mg Oral Daily  . atorvastatin  80 mg Oral q1800  . doxycycline  100 mg Oral Q12H  . furosemide  40 mg Oral Daily  . heparin subcutaneous  5,000 Units Subcutaneous 3 times per day  . insulin aspart  0-15 Units Subcutaneous TID WC  . insulin aspart  0-5 Units Subcutaneous QHS  . insulin glargine  60 Units Subcutaneous Daily  . ipratropium-albuterol  3 mL Nebulization BID  . isosorbide mononitrate  30 mg Oral Daily  . metoprolol tartrate  25 mg Oral BID  . sodium chloride  3 mL Intravenous Q12H     PRN Medications: sodium chloride, acetaminophen, albuterol, morphine injection, nitroGLYCERIN, ondansetron (ZOFRAN) IV, ondansetron, sodium chloride  Echocardiogram (06/17/2013): Study Conclusions  - Left ventricle: The cavity size was normal. Wall thickness was increased in a pattern of moderate LVH. Systolic function was moderately reduced. The estimated ejection fraction was in the range of 35% to 40%. Diffuse hypokinesis. There is severe hypokinesis of the inferolateral myocardium. Doppler parameters are consistent with restrictive physiology, indicative of decreased left ventricular diastolic compliance and/or increased left atrial pressure. - Aortic valve: Moderately calcified annulus. Trileaflet;  moderately calcified leaflets. Trivial regurgitation. Mean gradient: 10mm Hg (S). Valve area: 1.02cm^2(VTI). Peak velocity ratio of LVOT to aortic valve: 0.32. Valve area: 1cm^2 (Vmax). - Mitral valve: Mildly thickened leaflets . Moderate regurgitation. Regurgitant volume: 21ml (PISA). - Left atrium: The atrium was severely dilated. - Right ventricle: The cavity size was moderately dilated. Systolic function was moderately reduced. - Right atrium: The atrium was moderately dilated. Central venous pressure: 15mm Hg (est). - Atrial septum: No defect or patent foramen ovale  was identified. - Tricuspid valve: Moderate regurgitation. - Pulmonary arteries: Systolic pressure was moderately to severely increased. PA peak pressure: 62mm Hg (S). - Pericardium, extracardiac: A small pericardial effusion was identified posterior to the heart. Impressions:  - Normal chamber size with moderate LVH and LVEF 35-40%, restrictive diastolic filling pattern. Severe left atrial enlargement. Thickened mitral valve with moderate regurgitation. Probable low gradient, severe calcific aortic stenosis with trivial regurgitation (could be further elucidated with dobutamiine echocardiogram if clinically indicated). Moderate RV dilatation with reduced function. Moderate tricuspid regurgitation witn PASP 62 mmHg and elevated CVP. Small posterior pericardial effusion.   Assessment:   1. NSTEMI, relatively minor troponin I elevation to 0.9. Currently chest pain-free. Followup ECG reviewed above.  2. PAF versus ectopic atrial tachycardia/flutter. Suspect is also is exacerbating ischemic heart disease. Amiodarone has been increased he continues on Lopressor. Not anticoagulated with history of GI bleeding.  3. Ischemic cardiomyopathy based on noninvasive testing. Patient has adamantly declined invasive evaluation over time and is being managed medically. Followup echocardiogram shows LVEF 35-40% this time.  4. Probable severe calcific aortic stenosis, low gradient. Could be further elucidated via dobutamine echocardiogram, but not clearly indicated as the patient declines further invasive management.  5. Chronic kidney disease, probable stage 2-3. Creatinine 1.4.   Plan/Discussion:    Suspect his episodes of PAF/atrial tachycardia are exacerbating ischemic cardiomyopathy symptoms. As already defined, he is not a good anticoagulation candidate, and he also declines further invasive evaluation. Amiodarone has been increased and he continues on Lopressor. Baseline heart rate is in the  60s and rhythm is more stable. Holding off ACE inhibitor or ARB at this point with renal insufficiency and typically low normal blood pressure. Overall prognosis is poor, would make sure that he has DNR status in place.   Richard SidleSamuel G. [name], M.D., F.A.C.C.

## 2013-06-18 NOTE — Progress Notes (Signed)
Critical 12 lead EKG result.  Showing AV block 2nd degree with 2:1 AV conduction.  MD notified of results via text page.

## 2013-06-18 NOTE — Progress Notes (Signed)
PROGRESS NOTE  Richard BooneSamuel L [name] ZOX:[id number] DOB: [birth year] DOA: 06/17/2013 PCP: [name],ASHISH, MD  Assessment/Plan: Non-STEMI/Acute coronary syndrome, chest pain currently resolved.  - Continue heparin  - Increase to full dose aspirin  - Defer addition of plavix to cardiology if indicated  - Add atorvastatin  - Patient not on beta blocker secondary to low BP while on dilt for a-fib  - Continue prn NTG  - Tele: A-fib, rate controlled  Atrial flutter with variable conduction/a-fib, rate controlled  - appreciate cardiology - Continue amiodarone  - Aspirin for long-term stroke prevention despite high CHADS score secondary to GIB  -continue metoprolol tartrate ICM, unknown EF  - ECHO-EF 35-40% - Judicious use of IVF  - Continue lasix 40mg  daily  HTN, - blood pressure low normal  Diabetes mellitus, normally on lantus 60 units AM and 15 units HS, CBG well controlled  - D/c evening lantus  - Increase SSI to moderate dose and add HS insulin  COPD, stable. continue duonebs BID with albuterol prn  Bleeding ulcers and hx of diverticular bleed, no active bleeding. Last bleed about 2  CKD stage 3-4 - Trend creatinine  Chronic anemia, likely due ot chronic disease and iron deficiency  - Defer to PCP as this work up is likely already complete, also tests may be affected by acute illness  - Transfuse for hgb < 7 or evidence of hemodynamic instability and active bleeding  Stasis dermatitis vs. Cellulitis of bilateral lower extremities  - Elevate legs  - Compression stockings  - Diuresis as tolerated  - Continue doxycycline  Ulcer, stage 3, in gluteal crease, present at admission and for over last two months  - Wound care consultation  Diet: Healthy heart  Access: PIV  IVF: Off  Proph: Heparin  Code Status: DO NOT RESUSCITATE    Family Communication:   Pt at beside Disposition Plan:   Home when medically stable       Procedures/Studies: Dg Chest Portable 1  View  06/17/2013   CLINICAL DATA:  Chest pain  EXAM: PORTABLE CHEST - 1 VIEW  COMPARISON:  DG CHEST 1V PORT dated 06/01/2012  FINDINGS: There is no focal parenchymal opacity, pleural effusion, or pneumothorax. Stable cardiomegaly. The osseous structures are unremarkable.  IMPRESSION: No active disease.   Electronically Signed   By: Elige KoHetal  Patel   On: 06/17/2013 01:46         Subjective: Patient denies fevers, chills, headache, chest pain, dyspnea, nausea, vomiting, diarrhea, abdominal pain, dysuria, hematuria   Objective: Filed Vitals:   06/18/13 0815 06/18/13 0900 06/18/13 1000 06/18/13 1130  BP:  98/44 113/72   Pulse: 69 121 126   Temp:    98 F (36.7 C)  TempSrc:    Oral  Resp: 20 16 20    Height:      Weight:      SpO2: 96% 98% 99%     Intake/Output Summary (Last 24 hours) at 06/18/13 1254 Last data filed at 06/17/13 2115  Gross per 24 hour  Intake    240 ml  Output      0 ml  Net    240 ml   Weight change: 0.044 kg (1.6 oz) Exam:   General:  Pt is alert, follows commands appropriately, not in acute distress  HEENT: No icterus, No thrush,  Globe/AT  Cardiovascular: RRR, S1/S2, no rubs, no gallops  Respiratory: Diminished breath sounds at the bases without any crackles.  Abdomen:  Soft/+BS, non tender, non distended, no guarding  Extremities: 1+LE edema, patchy areas of erythema in the bilateral pretibial areas without crepitance.  Data Reviewed: Basic Metabolic Panel:  Recent Labs Lab 06/17/13 0145 06/17/13 0552 06/18/13 0446  NA 134* 136* 138  K 4.5 4.1 4.5  CL 92* 95* 98  CO2 29 29 28   GLUCOSE 261* 198* 168*  BUN 25* 24* 25*  CREATININE 1.54* 1.43* 1.47*  CALCIUM 9.5 9.2 9.0   Liver Function Tests:  Recent Labs Lab 06/17/13 0145 06/17/13 0552  AST 31 17  ALT 12 10  ALKPHOS 73 71  BILITOT 0.2* 0.3  PROT 7.6 7.1  ALBUMIN 3.4* 3.3*   No results found for this basename: LIPASE, AMYLASE,  in the last 168 hours No results found for this  basename: AMMONIA,  in the last 168 hours CBC:  Recent Labs Lab 06/17/13 0145 06/17/13 0552  WBC 7.4 7.5  HGB 11.3* 10.9*  HCT 35.0* 34.0*  MCV 86.2 85.9  PLT 191 175   Cardiac Enzymes:  Recent Labs Lab 06/17/13 0145 06/17/13 0552 06/17/13 1027 06/17/13 1722  TROPONINI 0.84* 0.94* 0.79* 0.90*   BNP: No components found with this basename: POCBNP,  CBG:  Recent Labs Lab 06/17/13 1119 06/17/13 1604 06/17/13 2122 06/18/13 0721 06/18/13 1112  GLUCAP 312* 237* 183* 143* 304*    Recent Results (from the past 240 hour(s))  MRSA PCR SCREENING     Status: None   Collection Time    06/17/13  5:13 AM      Result Value Ref Range Status   MRSA by PCR NEGATIVE  NEGATIVE Final   Comment:            The GeneXpert MRSA Assay (FDA     approved for NASAL specimens     only), is one component of a     comprehensive MRSA colonization     surveillance program. It is not     intended to diagnose MRSA     infection nor to guide or     monitor treatment for     MRSA infections.     Scheduled Meds: . amiodarone  200 mg Oral BID  . antiseptic oral rinse  15 mL Mouth Rinse BID  . aspirin EC  325 mg Oral Daily  . atorvastatin  80 mg Oral q1800  . doxycycline  100 mg Oral Q12H  . furosemide  40 mg Oral Daily  . heparin subcutaneous  5,000 Units Subcutaneous 3 times per day  . insulin aspart  0-15 Units Subcutaneous TID WC  . insulin aspart  0-5 Units Subcutaneous QHS  . insulin glargine  60 Units Subcutaneous Daily  . ipratropium-albuterol  3 mL Nebulization BID  . isosorbide mononitrate  30 mg Oral Daily  . metoprolol tartrate  25 mg Oral BID  . sodium chloride  3 mL Intravenous Q12H   Continuous Infusions:    Richard Hartshornavid Stillman Buenger, DO  [facility] Pager [phone]  If 7PM-7AM, please contact night-coverage [email or web address] Password [email or web address] 06/18/2013, 12:54 PM   LOS: 1 day

## 2013-06-18 NOTE — Clinical Social Work Note (Signed)
Update given to Marylene Landngela at [facility], facility continues to be willing to take patient back at discharge.  Santa GeneraAnne Goddess Gebbia, LCSW Clinical Social Worker [phone])

## 2013-06-19 DIAGNOSIS — I38 Endocarditis, valve unspecified: Secondary | ICD-10-CM

## 2013-06-19 DIAGNOSIS — N183 Chronic kidney disease, stage 3 unspecified: Secondary | ICD-10-CM

## 2013-06-19 LAB — BASIC METABOLIC PANEL
BUN: 28 mg/dL — ABNORMAL HIGH (ref 6–23)
CO2: 27 meq/L (ref 19–32)
Calcium: 9.3 mg/dL (ref 8.4–10.5)
Chloride: 94 mEq/L — ABNORMAL LOW (ref 96–112)
Creatinine, Ser: 1.49 mg/dL — ABNORMAL HIGH (ref 0.50–1.35)
GFR calc Af Amer: 49 mL/min — ABNORMAL LOW (ref >90)
GFR, EST NON AFRICAN AMERICAN: 43 mL/min — AB (ref >90)
GLUCOSE: 303 mg/dL — AB (ref 70–99)
POTASSIUM: 4.5 meq/L (ref 3.7–5.3)
SODIUM: 136 meq/L — AB (ref 137–147)

## 2013-06-19 LAB — GLUCOSE, CAPILLARY
GLUCOSE-CAPILLARY: 246 mg/dL — AB (ref 70–99)
Glucose-Capillary: 233 mg/dL — ABNORMAL HIGH (ref 70–99)
Glucose-Capillary: 136 mg/dL — ABNORMAL HIGH (ref 70–99)
Glucose-Capillary: 172 mg/dL — ABNORMAL HIGH (ref 70–99)

## 2013-06-19 LAB — CBC
HCT: 35 % — ABNORMAL LOW (ref 39.0–52.0)
HEMOGLOBIN: 11 g/dL — AB (ref 13.0–17.0)
MCH: 27.6 pg (ref 26.0–34.0)
MCHC: 31.4 g/dL (ref 30.0–36.0)
MCV: 87.7 fL (ref 78.0–100.0)
Platelets: 185 10*3/uL (ref 150–400)
RBC: 3.99 MIL/uL — AB (ref 4.22–5.81)
RDW: 17.7 % — ABNORMAL HIGH (ref 11.5–15.5)
WBC: 7.2 10*3/uL (ref 4.0–10.5)

## 2013-06-19 MED ORDER — INSULIN ASPART 100 UNIT/ML ~~LOC~~ SOLN
4.0000 [IU] | Freq: Three times a day (TID) | SUBCUTANEOUS | Status: DC
  Administered 2013-06-19 – 2013-06-20 (×4): 4 [IU] via SUBCUTANEOUS

## 2013-06-19 NOTE — Progress Notes (Signed)
Consulting cardiologist: Nona DellMcDowell, Chad MD Primary Cardiologist: Nona DellMcDowell, Arles MD  Subjective:   No complaints of chest pain. Breathing status "about the same."   Objective:   Temp:  [98 F (36.7 C)-100.1 F (37.8 C)] 100.1 F (37.8 C) (04/16 0400) Pulse Rate:  [121-126] 125 (04/15 1100) Resp:  [14-28] 20 (04/16 0600) BP: (93-135)/(44-96) 108/74 mmHg (04/16 0600) SpO2:  [97 %-99 %] 98 % (04/15 1943) Weight:  [237 lb 10.5 oz (107.8 kg)] 237 lb 10.5 oz (107.8 kg) (04/16 0500) Last BM Date: 06/18/13 (3 loose stools since this afternoon per patient.)  Filed Weights   06/17/13 0500 06/18/13 0500 06/19/13 0500  Weight: 236 lb 8.9 oz (107.3 kg) 238 lb 1.6 oz (108 kg) 237 lb 10.5 oz (107.8 kg)    Intake/Output Summary (Last 24 hours) at 06/19/13 0816 Last data filed at 06/18/13 1753  Gross per 24 hour  Intake    480 ml  Output    200 ml  Net    280 ml    Telemetry: NSR rates in the 60's.   Exam:  General: No acute distress.  Lungs: Clear to auscultation, nonlabored.  Cardiac: RRR, indistinct PMI, 2/6 systolic murmur at the apex.  Extremities: Bilateral 1+ pitting edema with erythema of the left pre-tibial area, with warmth to touch, with improvement in erythema on the right. Some discomfort to palpation.   Lab Results:  Basic Metabolic Panel:  Recent Labs Lab 06/17/13 0552 06/18/13 0446 06/19/13 0519  NA 136* 138 136*  K 4.1 4.5 4.5  CL 95* 98 94*  CO2 29 28 27   GLUCOSE 198* 168* 303*  BUN 24* 25* 28*  CREATININE 1.43* 1.47* 1.49*  CALCIUM 9.2 9.0 9.3    Liver Function Tests:  Recent Labs Lab 06/17/13 0145 06/17/13 0552  AST 31 17  ALT 12 10  ALKPHOS 73 71  BILITOT 0.2* 0.3  PROT 7.6 7.1  ALBUMIN 3.4* 3.3*    CBC:  Recent Labs Lab 06/17/13 0145 06/17/13 0552 06/19/13 0519  WBC 7.4 7.5 7.2  HGB 11.3* 10.9* 11.0*  HCT 35.0* 34.0* 35.0*  MCV 86.2 85.9 87.7  PLT 191 175 185    Cardiac Enzymes:  Recent Labs Lab  06/17/13 0552 06/17/13 1027 06/17/13 1722  TROPONINI 0.94* 0.79* 0.90*     Medications:   Scheduled Medications: . amiodarone  200 mg Oral BID  . antiseptic oral rinse  15 mL Mouth Rinse BID  . aspirin EC  325 mg Oral Daily  . atorvastatin  80 mg Oral q1800  . doxycycline  100 mg Oral Q12H  . furosemide  40 mg Oral Daily  . heparin subcutaneous  5,000 Units Subcutaneous 3 times per day  . insulin aspart  0-15 Units Subcutaneous TID WC  . insulin aspart  0-5 Units Subcutaneous QHS  . insulin glargine  60 Units Subcutaneous Daily  . ipratropium-albuterol  3 mL Nebulization BID  . isosorbide mononitrate  30 mg Oral Daily  . metoprolol tartrate  25 mg Oral BID  . sodium chloride  3 mL Intravenous Q12H      PRN Medications:  sodium chloride, acetaminophen, albuterol, morphine injection, nitroGLYCERIN, ondansetron (ZOFRAN) IV, ondansetron, sodium chloride   Assessment and Plan:   1. NSTEMI: He is without complaint of recurrent chest pain. He refuses further invasive testing at this time. Continue medical and risk management.   2. PAF versus ectopic atrial tachycardia/flutter: Currently controlled. Not on anticoagulation due to hx of GIB. Remains on  amiodarone and lopressor. Rate is well controlled on review of telemetry.   3. ICM: Recent echo demonstrates EF of 35%-40%. Continue on diuretics, BB. Not on ACE due to mild renal insufficiency and history of hyperkalemia.   4. 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 Patient declines further invasive workup/management.  5. Cellulitis of lower extremities: He is on doxycycline treatment. Left lower extremity is still erythematous with mild pain on palpation. Improvement is noted however.    Bettey MareKathryn M. Lyman BishopLawrence NP [facility] 06/19/2013, 8:16 AM   Attending note:  Patient seen and examined. Modified above note by Ms. Lawrence NP. Patient is more clinically stable at this point and  his rhythm is better controlled. Would continue current regimen, consider transfer to telemetry. He has limited functional status at baseline, uses a wheelchair. Need to see if he is getting close to discharge back to prior assisted living facility. As noted previously, due to his multiple comorbidities that are being managed conservatively, he has overall poor prognosis, and should have an out of facility DNR.  Jonelle SidleSamuel G. McDowell, M.D., F.A.C.C.

## 2013-06-19 NOTE — Clinical Social Work Note (Signed)
Update [facility], facility can [facility] patient at discharge.    Santa GeneraAnne Cunningham, LCSW Clinical Social Worker [phone])

## 2013-06-19 NOTE — Progress Notes (Signed)
PROGRESS NOTE  Richard BooneSamuel L [name] ZOX:[id number] DOB: 12/22/1930 DOA: 06/17/2013 PCP: [name],ASHISH, MD  Assessment/Plan: Non-STEMI/Acute coronary syndrome,  -chest pain currently resolved.  - Continue full dose aspirin  - Add atorvastatin  - Continue prn NTG, imdur - Tele: A-fib, rate controlled  -Appreciate cardiology followup Atrial flutter with variable conduction -Spontaneous conversion to sinus rhythm 06/18/2013 p.m. - appreciate cardiology  - Continue amiodarone bid - Aspirin for long-term stroke prevention despite high CHADS score secondary to GIB  -continue metoprolol tartrate  Ischemic cardiomyopathy - ECHO-EF 35-40% - Continue lasix 40mg  daily  HTN,  - Controlled Diabetes mellitus, type 2 -normally on lantus 60 units AM and 15 units HS, CBG well controlled  - D/c evening lantus  - Increase SSI to moderate dose and add HS insulin  -HbA1C -Add pre-meal NovoLog, 4 units COPD,  -stable. continue duonebs BID with albuterol prn  Bleeding ulcers and hx of diverticular bleed  -no active bleeding. Last bleed about 712yrs ago  CKD stage 3-4  - Trend creatinine  -Baseline creatinine 1.0-1.4 Chronic anemia -due ot chronic disease and iron deficiency  - Defer to PCP as this work up is likely already complete, also tests may be affected by acute illness  - Transfuse for hgb < 7 or evidence of hemodynamic instability and active bleeding  Stasis dermatitis vs. Cellulitis -bilateral lower extremities  -Erythema is improving on doxycycline po-->continue - Elevate legs  - Compression stockings is able to tolerate - Diuresis as tolerated  - Continue doxycycline  Ulcer, stage 3, in gluteal crease, present at admission and for over last two months  - Wound care consultation  Diet: Healthy heart  Access: PIV  IVF: Off  Proph: Heparin  Code Status: DO NOT RESUSCITATE Disposition Plan:   Transfer the medical floor, possible home on  06/20/2048   Antibiotics:  Doxycycline 06/17/2013    Procedures/Studies: Dg Chest Portable 1 View  06/17/2013   CLINICAL DATA:  Chest pain  EXAM: PORTABLE CHEST - 1 VIEW  COMPARISON:  DG CHEST 1V PORT dated 06/01/2012  FINDINGS: There is no focal parenchymal opacity, pleural effusion, or pneumothorax. Stable cardiomegaly. The osseous structures are unremarkable.  IMPRESSION: No active disease.   Electronically Signed   By: Elige KoHetal  Patel   On: 06/17/2013 01:46         Subjective: Patient denies fevers, chills, headache, chest pain, dyspnea, nausea, vomiting, diarrhea, abdominal pain, dysuria, hematuria   Objective: Filed Vitals:   06/19/13 0400 06/19/13 0500 06/19/13 0600 06/19/13 0800  BP: 127/55 126/62 108/74   Pulse:      Temp: 100.1 F (37.8 C)   98.5 F (36.9 C)  TempSrc: Oral   Oral  Resp: 24 20 20    Height:      Weight:  107.8 kg (237 lb 10.5 oz)    SpO2:        Intake/Output Summary (Last 24 hours) at 06/19/13 0825 Last data filed at 06/18/13 1753  Gross per 24 hour  Intake    480 ml  Output    200 ml  Net    280 ml   Weight change: -0.2 kg (-7.1 oz) Exam:   General:  Pt is alert, follows commands appropriately, not in acute distress  HEENT: No icterus, No thrush, [place]/AT  Cardiovascular: RRR, S1/S2, no rubs, no gallops  Respiratory: CTA bilaterally, no wheezing, no crackles, no rhonchi  Abdomen: Soft/+BS, non tender, non distended, no guarding  Extremities:  2+LE edema, bilateral pretibial patchy areas of erythema without lymphangitis. No open or draining wounds. No crepitance.  Data Reviewed: Basic Metabolic Panel:  Recent Labs Lab 06/17/13 0145 06/17/13 0552 06/18/13 0446 06/19/13 0519  NA 134* 136* 138 136*  K 4.5 4.1 4.5 4.5  CL 92* 95* 98 94*  CO2 29 29 28 27   GLUCOSE 261* 198* 168* 303*  BUN 25* 24* 25* 28*  CREATININE 1.54* 1.43* 1.47* 1.49*  CALCIUM 9.5 9.2 9.0 9.3   Liver Function Tests:  Recent Labs Lab 06/17/13 0145  06/17/13 0552  AST 31 17  ALT 12 10  ALKPHOS 73 71  BILITOT 0.2* 0.3  PROT 7.6 7.1  ALBUMIN 3.4* 3.3*   No results found for this basename: LIPASE, AMYLASE,  in the last 168 hours No results found for this basename: AMMONIA,  in the last 168 hours CBC:  Recent Labs Lab 06/17/13 0145 06/17/13 0552 06/19/13 0519  WBC 7.4 7.5 7.2  HGB 11.3* 10.9* 11.0*  HCT 35.0* 34.0* 35.0*  MCV 86.2 85.9 87.7  PLT 191 175 185   Cardiac Enzymes:  Recent Labs Lab 06/17/13 0145 06/17/13 0552 06/17/13 1027 06/17/13 1722  TROPONINI 0.84* 0.94* 0.79* 0.90*   BNP: No components found with this basename: POCBNP,  CBG:  Recent Labs Lab 06/18/13 0721 06/18/13 1112 06/18/13 1609 06/18/13 2135 06/19/13 0734  GLUCAP 143* 304* 183* 255* 233*    Recent Results (from the past 240 hour(s))  MRSA PCR SCREENING     Status: None   Collection Time    06/17/13  5:13 AM      Result Value Ref Range Status   MRSA by PCR NEGATIVE  NEGATIVE Final   Comment:            The GeneXpert MRSA Assay (FDA     approved for NASAL specimens     only), is one component of a     comprehensive MRSA colonization     surveillance program. It is not     intended to diagnose MRSA     infection nor to guide or     monitor treatment for     MRSA infections.     Scheduled Meds: . amiodarone  200 mg Oral BID  . antiseptic oral rinse  15 mL Mouth Rinse BID  . aspirin EC  325 mg Oral Daily  . atorvastatin  80 mg Oral q1800  . doxycycline  100 mg Oral Q12H  . furosemide  40 mg Oral Daily  . heparin subcutaneous  5,000 Units Subcutaneous 3 times per day  . insulin aspart  0-15 Units Subcutaneous TID WC  . insulin aspart  0-5 Units Subcutaneous QHS  . insulin glargine  60 Units Subcutaneous Daily  . ipratropium-albuterol  3 mL Nebulization BID  . isosorbide mononitrate  30 mg Oral Daily  . metoprolol tartrate  25 mg Oral BID  . sodium chloride  3 mL Intravenous Q12H   Continuous Infusions:    Catarina Hartshornavid Anchor Dwan,  DO  [facility] Pager [phone]  If 7PM-7AM, please contact night-coverage [email or web address] Password [email or web address] 06/19/2013, 8:25 AM   LOS: 2 days

## 2013-06-20 ENCOUNTER — Encounter: Payer: Self-pay | Admitting: Cardiology

## 2013-06-20 LAB — BASIC METABOLIC PANEL
BUN: 29 mg/dL — ABNORMAL HIGH (ref 6–23)
CALCIUM: 9.3 mg/dL (ref 8.4–10.5)
CO2: 27 mEq/L (ref 19–32)
CREATININE: 1.6 mg/dL — AB (ref 0.50–1.35)
Chloride: 94 mEq/L — ABNORMAL LOW (ref 96–112)
GFR calc Af Amer: 45 mL/min — ABNORMAL LOW (ref >90)
GFR, EST NON AFRICAN AMERICAN: 39 mL/min — AB (ref >90)
GLUCOSE: 133 mg/dL — AB (ref 70–99)
Potassium: 3.8 mEq/L (ref 3.7–5.3)
SODIUM: 136 meq/L — AB (ref 137–147)

## 2013-06-20 LAB — GLUCOSE, CAPILLARY
Glucose-Capillary: 151 mg/dL — ABNORMAL HIGH (ref 70–99)
Glucose-Capillary: 113 mg/dL — ABNORMAL HIGH (ref 70–99)

## 2013-06-20 MED ORDER — ATORVASTATIN CALCIUM 80 MG PO TABS: 80.0000 mg | ORAL_TABLET | Freq: Every day | ORAL | Status: AC

## 2013-06-20 MED ORDER — METOPROLOL TARTRATE 25 MG PO TABS: 25.0000 mg | ORAL_TABLET | Freq: Two times a day (BID) | ORAL | Status: DC

## 2013-06-20 MED ORDER — ASPIRIN 325 MG PO TBEC: 325.0000 mg | DELAYED_RELEASE_TABLET | Freq: Every day | ORAL | Status: DC

## 2013-06-20 MED ORDER — DOXYCYCLINE HYCLATE 100 MG PO TABS: 100.0000 mg | ORAL_TABLET | Freq: Two times a day (BID) | ORAL | Status: DC

## 2013-06-20 MED ORDER — AMIODARONE HCL 200 MG PO TABS: 200.0000 mg | ORAL_TABLET | Freq: Two times a day (BID) | ORAL | Status: DC

## 2013-06-20 MED ORDER — ISOSORBIDE MONONITRATE ER 30 MG PO TB24: 30.0000 mg | ORAL_TABLET | Freq: Every day | ORAL | Status: AC

## 2013-06-20 NOTE — Evaluation (Signed)
Physical Therapy Evaluation Patient Details Name: Elita BooneSamuel L Mackley MRN: [id number] DOB: [birth year] Today's Date: 06/20/2013   History of Present Illness  Pt with ischemic cardiomyopathy was admitted with chest pain.  Troponins were elevated.  He was hospitalized 3 weeks ago with a Non STEMI.  [facility] and is w/c dependent.  The chart indicates that he walks short distances with a cane but pt refutes this.  He states that he is always just bed to chair with SBA.  He c/o general malaise today.  Clinical Impression   Pt was seen for evaluation.  He was alert, oriented and very pleasant.  He is emphatic that he will have no trouble with transition to SNF.  Evaluation does bear this out.  He is generally deconditioned but he is not interested in pusuing any additional PT.    Follow Up Recommendations No PT follow up    Equipment Recommendations  None recommended by PT    Recommendations for Other Services   none    Precautions / Restrictions Precautions Precautions: Fall Restrictions Weight Bearing Restrictions: No      Mobility  Bed Mobility Overal bed mobility: Modified Independent                Transfers Overall transfer level: Needs assistance Equipment used: None Transfers: Sit to/from Stand;Stand Pivot Transfers Sit to Stand: Supervision Stand pivot transfers: Supervision       General transfer comment: supervision for safety...pt did not need any physical assist  Ambulation/Gait:  N/A                Stairs:  N/A                      Balance Overall balance assessment: Modified Independent                                                Home Living Family/patient expects to be discharged to:: Skilled nursing facility                      Prior Function Level of Independence: Needs assistance   Gait / Transfers Assistance Needed: non ambulatory, SBA with  transfers  ADL's /  Homemaking Assistance Needed: assist with ADLs, self propels w/c                Extremity/Trunk Assessment               Lower Extremity Assessment: Generalized weakness         Communication   Communication: No difficulties  Cognition Arousal/Alertness: Awake/alert Behavior During Therapy: WFL for tasks assessed/performed Overall Cognitive Status: Within Functional Limits for tasks assessed                                    Assessment/Plan    PT Assessment Patent does not need any further PT services  PT Diagnosis     PT Problem List    PT Treatment Interventions     PT Goals (Current goals can be found in the Care Plan section) Acute Rehab PT Goals PT Goal Formulation: No goals set, d/c therapy         Barriers to discharge  none  End of Session Equipment Utilized During Treatment: Gait belt Activity Tolerance: Patient tolerated treatment well Patient left: in chair;with call bell/phone within reach Nurse Communication: Mobility status         Time: [id number] PT Time Calculation (min): 28 min   Charges:   PT Evaluation $Initial PT Evaluation Tier I: 1 Procedure     PT G Codes:          Konrad Pentalaudia L Darlene Brozowski 06/20/2013, 9:22 AM

## 2013-06-20 NOTE — Discharge Summary (Addendum)
Physician Discharge Summary  Richard BooneSamuel L [name] [name] DOB: [birth year] DOA: 06/17/2013  PCP: [name],ASHISH, MD  Admit date: 06/17/2013 Discharge date: 06/20/2013  Recommendations for Outpatient Follow-up:  1. Pt will need to follow up with PCP in 2 weeks post discharge 2. Please obtain BMP to evaluate electrolytes and kidney function 3. Please also check CBC to evaluate Hg and Hct levels  Discharge Diagnoses:  Non-STEMI/Acute coronary syndrome,  -chest pain currently resolved.  - Continue full dose aspirin  - Added atorvastatin 80 mg daily - Continue prn NTG, imdur  - Tele: A-fib, rate controlled  -Appreciate cardiology followup--patient does not want any further invasive workup Atrial flutter with variable conduction  -Spontaneous conversion to sinus rhythm 06/18/2013 p.m.  - appreciate cardiology  - Continue amiodarone bid  - Aspirin for long-term stroke prevention despite high CHADS score secondary to GIB  -continue metoprolol tartrate 25 mg twice a day Ischemic cardiomyopathy  - ECHO-EF 35-40%  - Continue lasix 40mg  daily  Stage III Pressure ulcer -Ulcer, stage 3, in gluteal crease, present at admission and for over last two months  HTN,  - Controlled  Diabetes mellitus, type 2  -normally on lantus 60 units AM and 15 units HS, CBG well controlled  - Increase SSI to moderate dose and add HS insulin  -Add pre-meal NovoLog, 4 units-->[facility] sliding scale -He will need to have his CBGs checked before each meal and at bedtime and have his insulin regimen adjusted based on his sugars.  COPD,  -stable. continue duonebs BID with albuterol prn  Bleeding ulcers and hx of diverticular bleed  -no active bleeding. Last bleed about 6872yrs ago  CKD stage 3-4  - Trend creatinine  -Baseline creatinine 1.0-1.4  Chronic anemia  -due ot chronic disease and iron deficiency  - Defer to PCP as this work up is likely already complete, also tests  may be affected by acute illness  - Transfuse for hgb < 7 or evidence of hemodynamic instability and active bleeding  Stasis dermatitis vs. Cellulitis  -bilateral lower extremities  -Erythema is improving on doxycycline po-->continue  - Elevate legs  - Compression stockings is able to tolerate  - Diuresis as tolerated--continue home dose of furosemide 40 mg daily  - Continue doxycycline for 6 more days which will complete 10 days of therapy Ulcer, stage 3, in gluteal crease, present at admission and for over last two months  - Wound care consultation  Diet: Healthy heart  Access: PIV  IVF: Off  Proph: Heparin  Code Status: DO NOT RESUSCITATE   Discharge Condition: stable  Disposition: SNF  Diet:cardiac Wt Readings from Last 3 Encounters:  06/20/13 105.8 kg (233 lb 4 oz)  01/08/13 102.967 kg (227 lb)  07/04/12 78.926 kg (174 lb)    History of present illness:  80 y.o.male past medical history of Essential hypertension, benign; Type 2 diabetes mellitus; Coronary atherosclerosis of native coronary artery; History of GI diverticular bleed; History of gastric ulcer; History of skin cancer; Permanent atrial fibrillation; NSTEMI (non-ST elevated myocardial infarction); Ischemic cardiomyopathy; Orthostatic hypotension; Acute kidney failure; and Anemia. admitted with chest pain, intermittent since Friday without obvious precipitant. He has known history of presumed ICM, atrial fibrillation, (not on anticoagulation due to GI bleed), LVEF 30-35% - managed conservatively, carotid artery disease and renal insufficiency. He resides at assisted living facility. He was found to have positive cardiac markers with troponin 0.84; 0.94 respectively.  Patient is currently residing at  [facility], he was admitted recently to [facility] hospital 3 weeks ago with uncontrolled atrial fibrillation, anemia, pneumonia, non-STEMI. At that time patient was managed conservatively with no intake ablation due to  history of GI bleed. Patient has been followed by Dr. Gracy Racer as outpatient, and has been managed conservatively for ischemic cardiomyopathy.  He states that he has been having intermittent chest tightness for 4 days prior to coming to ER. He took NTG with some relief but pain returned about on hour later. Pain occurred at rest. He is very sedentary using a wheelchair for ambulation.He has also noticed some shortness of breath and LEE with weeping prior to coming to hospital.  On arrival to ER he was mildly hypertensive, with BP of 140/92, HR of 152 bpm. CXR negative for CHF. EKG atrial fib and marked ST depression in the inferior/lateral leads. He was treated with diltiazem bolus and then started on a gtt, heparin bolus with IV infusion. He was started on IV lasix . Dr. Shirlee Latch was called at [facility] who did not recommend transport to [facility]. The patient refuses invasive testing. He is not on ACE inhibitor or ARB in the setting of acute renal failure in the past, and propensity for hyperkalemia.    Consultants: Cardiology  Discharge Exam: Filed Vitals:   06/20/13 0900  BP: 170/59  Pulse:   Temp:   Resp: 17   Filed Vitals:   06/20/13 0700 06/20/13 0800 06/20/13 0829 06/20/13 0900  BP: 122/65 135/56  170/59  Pulse: 58 58    Temp:  98.1 F (36.7 C)    TempSrc:  Oral    Resp: 25 15  17   Height:      Weight:      SpO2: 95% 89% 97%    General: A&O x 3, NAD, pleasant, cooperative Cardiovascular: RRR, no rub, no gallop, no S3 Respiratory: CTAB, no wheeze, no rhonchi Abdomen:soft, nontender, nondistended, positive bowel sounds Extremities: !+ edema with patchy areas of erythema without any open draining wounds.  Discharge Instructions       Future Appointments Provider Department Dept Phone   07/16/2013 3:00 PM Jonelle Sidle, MD [facility] [phone]       Medication List    STOP taking these medications       aspirin 81 MG tablet  Replaced by:   aspirin 325 MG EC tablet      TAKE these medications       acetaminophen 650 MG CR tablet  Commonly known as:  TYLENOL  Take 650 mg by mouth every 4 (four) hours as needed for pain or fever (For temp of 101 or higher.  6 dose max in 24 hours).     albuterol (2.5 MG/3ML) 0.083% nebulizer solution  Commonly known as:  PROVENTIL  Take 2.5 mg by nebulization every 6 (six) hours as needed for wheezing.     amiodarone 200 MG tablet  Commonly known as:  PACERONE  Take 1 tablet (200 mg total) by mouth 2 (two) times daily.     aspirin 325 MG EC tablet  Take 1 tablet (325 mg total) by mouth daily.     atorvastatin 80 MG tablet  Commonly known as:  LIPITOR  Take 1 tablet (80 mg total) by mouth daily at 6 PM.     cyanocobalamin 1000 MCG/ML injection  Commonly known as:  (VITAMIN B-12)  Inject 1,000 mcg into the skin every 30 (thirty) days.     doxycycline 100  MG tablet  Commonly known as:  VIBRA-TABS  Take 1 tablet (100 mg total) by mouth every 12 (twelve) hours.     furosemide 40 MG tablet  Commonly known as:  LASIX  Take 40 mg by mouth daily.     insulin aspart 100 UNIT/ML injection  Commonly known as:  novoLOG  Inject 0-12 Units into the skin daily. Novolog 0-12 units at 6am and 4pm Sliding scale and Novolog 10 units Q 5:30 pm (verified with Nurse at [facility])     insulin glargine 100 UNIT/ML injection  Commonly known as:  LANTUS  Inject 15-60 Units into the skin 2 (two) times daily. Patient takes 15 units at bedtime and 60 units at 0800     ipratropium-albuterol 0.5-2.5 (3) MG/3ML Soln  Commonly known as:  DUONEB  Take 3 mLs by nebulization 2 (two) times daily.     isosorbide mononitrate 30 MG 24 hr tablet  Commonly known as:  IMDUR  Take 1 tablet (30 mg total) by mouth daily.     metoprolol tartrate 25 MG tablet  Commonly known as:  LOPRESSOR  Take 1 tablet (25 mg total) by mouth 2 (two) times daily.     multivitamin tablet  Take 1 tablet by mouth daily.      polyethylene glycol packet  Commonly known as:  MIRALAX / GLYCOLAX  Take 17 g by mouth daily.     psyllium 95 % Pack  Commonly known as:  HYDROCIL/METAMUCIL  Take 1 packet by mouth 2 (two) times daily.     traMADol 50 MG tablet  Commonly known as:  ULTRAM  Take 50 mg by mouth 3 (three) times daily as needed for moderate pain.         The results of significant diagnostics from this hospitalization (including imaging, microbiology, ancillary and laboratory) are listed below for reference.    Significant Diagnostic Studies: Dg Chest Portable 1 View  06/17/2013   CLINICAL DATA:  Chest pain  EXAM: PORTABLE CHEST - 1 VIEW  COMPARISON:  DG CHEST 1V PORT dated 06/01/2012  FINDINGS: There is no focal parenchymal opacity, pleural effusion, or pneumothorax. Stable cardiomegaly. The osseous structures are unremarkable.  IMPRESSION: No active disease.   Electronically Signed   By: Elige Ko   On: 06/17/2013 01:46     Microbiology: Recent Results (from the past 240 hour(s))  MRSA PCR SCREENING     Status: None   Collection Time    06/17/13  5:13 AM      Result Value Ref Range Status   MRSA by PCR NEGATIVE  NEGATIVE Final   Comment:            The GeneXpert MRSA Assay (FDA     approved for NASAL specimens     only), is one component of a     comprehensive MRSA colonization     surveillance program. It is not     intended to diagnose MRSA     infection nor to guide or     monitor treatment for     MRSA infections.     Labs: Basic Metabolic Panel:  Recent Labs Lab 06/17/13 0145 06/17/13 0552 06/18/13 0446 06/19/13 0519 06/20/13 0428  NA 134* 136* 138 136* 136*  K 4.5 4.1 4.5 4.5 3.8  CL 92* 95* 98 94* 94*  CO2 29 29 28 27 27   GLUCOSE 261* 198* 168* 303* 133*  BUN 25* 24* 25* 28* 29*  CREATININE 1.54* 1.43* 1.47* 1.49*  1.60*  CALCIUM 9.5 9.2 9.0 9.3 9.3   Liver Function Tests:  Recent Labs Lab 06/17/13 0145 06/17/13 0552  AST 31 17  ALT 12 10  ALKPHOS 73 71    BILITOT 0.2* 0.3  PROT 7.6 7.1  ALBUMIN 3.4* 3.3*   No results found for this basename: LIPASE, AMYLASE,  in the last 168 hours No results found for this basename: AMMONIA,  in the last 168 hours CBC:  Recent Labs Lab 06/17/13 0145 06/17/13 0552 06/19/13 0519  WBC 7.4 7.5 7.2  HGB 11.3* 10.9* 11.0*  HCT 35.0* 34.0* 35.0*  MCV 86.2 85.9 87.7  PLT 191 175 185   Cardiac Enzymes:  Recent Labs Lab 06/17/13 0145 06/17/13 0552 06/17/13 1027 06/17/13 1722  TROPONINI 0.84* 0.94* 0.79* 0.90*   BNP: No components found with this basename: POCBNP,  CBG:  Recent Labs Lab 06/19/13 0734 06/19/13 1117 06/19/13 1653 06/19/13 2145 06/20/13 0745  GLUCAP 233* 246* 136* 172* 151*    Time coordinating discharge:  Greater than 30 minutes  Signed:  Catarina Hartshornavid Aizik Reh, DO [facility] Pager: [phone] 06/20/2013, 11:10 AM

## 2013-06-20 NOTE — Progress Notes (Signed)
Pt a/o.vss. Up with assistance X1. Saline lock removed. Denies any distress. Report [facility] to [facility]. Pt awaiting for transportation back to the facility.

## 2013-06-20 NOTE — Clinical Social Work Note (Signed)
Patient ready for discharge today, will return [facility] w facility transport.  FL2 reviewed w RN and updated as needed.  Discharge summary faxed to facility, packet prepared and placed w shadow chart for transport.  Patient and facility agreeable to transfer today.  Facility states they will arrive after lunch today.  CSW signing off as no further SW needs identified.  Santa GeneraAnne Deserae Jennings, LCSW Clinical Social Worker [phone])

## 2013-06-20 NOTE — Progress Notes (Signed)
Consulting cardiologist: Nona DellMcDowell, Alexandra MD Primary Cardiologist: Nona DellMcDowell, Darris MD  Subjective:   Feeling about the same. Wants to go home. No recurrence of chest pain.    Objective:   Temp:  [98.1 F (36.7 C)-99.6 F (37.6 C)] 98.1 F (36.7 C) (04/17 0800) Pulse Rate:  [51-65] 58 (04/17 0800) Resp:  [14-31] 17 (04/17 0900) BP: (104-170)/(45-76) 170/59 mmHg (04/17 0900) SpO2:  [89 %-97 %] 97 % (04/17 0829) Weight:  [233 lb 4 oz (105.8 kg)] 233 lb 4 oz (105.8 kg) (04/17 0500) Last BM Date: 06/18/13  Filed Weights   06/18/13 0500 06/19/13 0500 06/20/13 0500  Weight: 238 lb 1.6 oz (108 kg) 237 lb 10.5 oz (107.8 kg) 233 lb 4 oz (105.8 kg)    Intake/Output Summary (Last 24 hours) at 06/20/13 1102 Last data filed at 06/19/13 2000  Gross per 24 hour  Intake    120 ml  Output      0 ml  Net    120 ml    Telemetry: NSR rates in the 60's.   Exam:  General: No acute distress.  Lungs: Some crackles in the bases cleared with coughing.  Cardiac: RRR, 2/6 systolic murmur, no gallop or rub.   Abdomen: Normoactive bowel sounds, nontender, nondistended.  Extremities: No pitting edema, distal pulses full.   Lab Results:  Basic Metabolic Panel:  Recent Labs Lab 06/18/13 0446 06/19/13 0519 06/20/13 0428  NA 138 136* 136*  K 4.5 4.5 3.8  CL 98 94* 94*  CO2 28 27 27   GLUCOSE 168* 303* 133*  BUN 25* 28* 29*  CREATININE 1.47* 1.49* 1.60*  CALCIUM 9.0 9.3 9.3    Liver Function Tests:  Recent Labs Lab 06/17/13 0145 06/17/13 0552  AST 31 17  ALT 12 10  ALKPHOS 73 71  BILITOT 0.2* 0.3  PROT 7.6 7.1  ALBUMIN 3.4* 3.3*    CBC:  Recent Labs Lab 06/17/13 0145 06/17/13 0552 06/19/13 0519  WBC 7.4 7.5 7.2  HGB 11.3* 10.9* 11.0*  HCT 35.0* 34.0* 35.0*  MCV 86.2 85.9 87.7  PLT 191 175 185    Cardiac Enzymes:  Recent Labs Lab 06/17/13 0552 06/17/13 1027 06/17/13 1722  TROPONINI 0.94* 0.79* 0.90*     Medications:   Scheduled Medications: .  amiodarone  200 mg Oral BID  . antiseptic oral rinse  15 mL Mouth Rinse BID  . aspirin EC  325 mg Oral Daily  . atorvastatin  80 mg Oral q1800  . doxycycline  100 mg Oral Q12H  . furosemide  40 mg Oral Daily  . heparin subcutaneous  5,000 Units Subcutaneous 3 times per day  . insulin aspart  0-15 Units Subcutaneous TID WC  . insulin aspart  0-5 Units Subcutaneous QHS  . insulin aspart  4 Units Subcutaneous TID WC  . insulin glargine  60 Units Subcutaneous Daily  . ipratropium-albuterol  3 mL Nebulization BID  . isosorbide mononitrate  30 mg Oral Daily  . metoprolol tartrate  25 mg Oral BID  . sodium chloride  3 mL Intravenous Q12H    PRN Medications: sodium chloride, acetaminophen, albuterol, morphine injection, nitroGLYCERIN, ondansetron (ZOFRAN) IV, ondansetron, sodium chloride   Assessment and Plan:   1. NSTEMI: He is without complaint of recurrent chest pain. He refuses further invasive testing at this time. Continue medical and risk management.   2. PAF versus ectopic atrial tachycardia/flutter: Better controlled. Not on anticoagulation due to hx of GIB. Remains on amiodarone and lopressor.  3. ICM: Recent echocardiogram demonstrates LVEF of 35%-40%. Continues on diuretics, BB. Not on ACE due to mild renal insufficiency and history of hyperkalemia. Recommend moving to the floor.   4. 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 Patient declines further invasive workup/management.    Bettey MareKathryn M. Lyman BishopLawrence NP [facility] 06/20/2013, 11:02 AM   Attending note:  Patient seen and examined. Modified above note by Ms. Lawrence NP. Mr. Karna DupesGrogan remains clinically stable on present medical regimen. No further cardiac testing is planned at this time. PT has been evaluating the patient. Recommend transfer to telemetry, possible discharge soon.  Jonelle SidleSamuel G. Harriette Tovey, M.D., F.A.C.C.

## 2013-06-23 NOTE — Progress Notes (Signed)
UR chart review completed.  

## 2013-07-16 ENCOUNTER — Encounter: Payer: Self-pay | Admitting: Cardiology

## 2013-07-16 ENCOUNTER — Ambulatory Visit (INDEPENDENT_AMBULATORY_CARE_PROVIDER_SITE_OTHER): Payer: Medicare Other | Admitting: Cardiology

## 2013-07-16 VITALS — BP 149/63 | HR 49 | Ht 72.0 in | Wt 252.8 lb

## 2013-07-16 DIAGNOSIS — N183 Chronic kidney disease, stage 3 unspecified: Secondary | ICD-10-CM

## 2013-07-16 DIAGNOSIS — I2589 Other forms of chronic ischemic heart disease: Secondary | ICD-10-CM

## 2013-07-16 DIAGNOSIS — I4891 Unspecified atrial fibrillation: Secondary | ICD-10-CM

## 2013-07-16 DIAGNOSIS — I255 Ischemic cardiomyopathy: Secondary | ICD-10-CM

## 2013-07-16 DIAGNOSIS — I38 Endocarditis, valve unspecified: Secondary | ICD-10-CM

## 2013-07-16 NOTE — Patient Instructions (Signed)
Continue all current medications. Your physician wants you to follow up in: 6 months.  You will receive a reminder letter in the mail one-two months in advance.  If you don't receive a letter, please call our office to schedule the follow up appointment   

## 2013-07-16 NOTE — Assessment & Plan Note (Signed)
Being managed conservatively at patient request. LVEF 35-40% by most recent echocardiogram. Currently not on ACE inhibitor or ARB with history of renal insufficiency and hyperkalemia. 

## 2013-07-16 NOTE — Assessment & Plan Note (Signed)
Recent creatinine 1.6. 

## 2013-07-16 NOTE — Assessment & Plan Note (Signed)
Also ectopic atrial tachycardia/flutter. Continues on aspirin, amiodarone, and Lopressor. Has been a poor candidate for anticoagulation with recurring severe GI bleeds.

## 2013-07-16 NOTE — Progress Notes (Signed)
Clinical Summary [name] is an 80 y.o.male last seen in November 2014. He was [facility] to [facility] in April with chest pain and enzymatic evidence of NSTEMI, also PAF versus ectopic atrial tachycardia/flutter. He has preferred a conservative approach without further invasive testing, and this was the case during his hospital evaluation. Medical therapy was adjusted and he stabilized.   Followup echocardiogram in April showed moderate LVH with LVEF 35-40%, restrictive diastolic filling pattern, severe left atrial enlargement, thickened mitral valve with moderate regurgitation, probable low gradient severe calcific aortic stenosis with trivial regurgitation, moderate RV dilatation with reduced contraction, moderate tricuspid regurgitation with PASP 62 mmHg and elevated CVP, small posterior pericardial effusion.  Lab work as of mid April showed BUN 29, creatinine 1.6, potassium 3.8.  I reviewed his medications. He reports no chest pain, uses a wheelchair and is reasonably limited in terms of his functional capacity.   No Known Allergies  Current Outpatient Prescriptions  Medication Sig Dispense Refill  . acetaminophen (TYLENOL) 650 MG CR tablet Take 650 mg by mouth every 4 (four) hours as needed for pain or fever (For temp of 101 or higher.  6 dose max in 24 hours).       Marland Kitchen. albuterol (PROVENTIL) (2.5 MG/3ML) 0.083% nebulizer solution Take 2.5 mg by nebulization every 6 (six) hours as needed for wheezing.      Marland Kitchen. amiodarone (PACERONE) 200 MG tablet Take 1 tablet (200 mg total) by mouth 2 (two) times daily.  60 tablet  1  . aspirin EC 325 MG EC tablet Take 1 tablet (325 mg total) by mouth daily.  30 tablet  0  . atorvastatin (LIPITOR) 80 MG tablet Take 1 tablet (80 mg total) by mouth daily at 6 PM.  30 tablet  0  . cyanocobalamin (,VITAMIN B-12,) 1000 MCG/ML injection Inject 1,000 mcg into the skin every 30 (thirty) days.       . furosemide (LASIX) 40 MG tablet Take 40 mg by mouth  daily.      . insulin aspart (NOVOLOG) 100 UNIT/ML injection Inject 0-12 Units into the skin daily. Novolog 0-12 units at 6am and 4pm Sliding scale and Novolog 10 units Q 5:30 pm (ver[facility] with [facility])      . insulin glargine (LANTUS) 100 UNIT/ML injection Inject 15-60 Units into the skin 2 (two) times daily. Patient takes 15 units at bedtime and 60 units at 0800      . ipratropium-albuterol (DUONEB) 0.5-2.5 (3) MG/3ML SOLN Take 3 mLs by nebulization 2 (two) times daily.       . isosorbide mononitrate (IMDUR) 30 MG 24 hr tablet Take 1 tablet (30 mg total) by mouth daily.  30 tablet  0  . metoprolol tartrate (LOPRESSOR) 25 MG tablet Take 1 tablet (25 mg total) by mouth 2 (two) times daily.  60 tablet  0  . Multiple Vitamin (MULTIVITAMIN) tablet Take 1 tablet by mouth daily.      . polyethylene glycol (MIRALAX / GLYCOLAX) packet Take 17 g by mouth daily.      . psyllium (HYDROCIL/METAMUCIL) 95 % PACK Take 1 packet by mouth 2 (two) times daily.      . traMADol (ULTRAM) 50 MG tablet Take 50 mg by mouth 3 (three) times daily as needed for moderate pain.        No current facility-administered medications for this visit.    Past Medical History  Diagnosis Date  . Essential hypertension, benign   .  Type 2 diabetes mellitus   . Coronary atherosclerosis of native coronary artery     Presumed and managed conservatively  . History of GI diverticular bleed   . History of gastric ulcer   . History of skin cancer   . Permanent atrial fibrillation   . NSTEMI (non-ST elevated myocardial infarction)   . Ischemic cardiomyopathy     LVEF 30-35%  . Orthostatic hypotension   . Acute kidney failure   . Anemia     Social History [name] reports that he quit smoking about 26 years ago. He does not have any smokeless tobacco history on file. [name] reports that he does not drink alcohol.  Review of Systems Does complain of soreness in his gluteal area, a chronic problem. Otherwise  negative.  Physical Examination Filed Vitals:   07/16/13 1500  BP: 149/63  Pulse: 49   Filed Weights   07/16/13 1500  Weight: 252 lb 12.8 oz (114.669 kg)    Chronically ill-appearing male in no acute distress. In wheelchair.  HEENT: Conjunctiva and lids normal, oropharynx clear with poor dentition.  Neck: Supple, no elevated JVP bilateral carotid bruits, no thyromegaly.  Lungs: Diminished but cear to auscultation, nonlabored breathing at rest.  Cardiac: Regular rate and rhythm, no S3, 2/6 systolic murmur, no pericardial rub.  Abdomen: Soft, nontender, bowel sounds present.  Extremities: 1+ edema, distal pulses 1+.  Skin: Warm and dry.  Musculoskeletal: No kyphosis.  Neuropsychiatric: Alert and oriented x3, affect grossly appropriate.   Problem List and Plan   Atrial fibrillation Also ectopic atrial tachycardia/flutter. Continues on aspirin, amiodarone, and Lopressor. Has been a poor candidate for anticoagulation with recurring severe GI bleeds.  Cardiomyopathy, ischemic Being managed conservatively at patient request. LVEF 35-40% by most recent echocardiogram. Currently not on ACE inhibitor or ARB with history of renal insufficiency and hyperkalemia.  Valvular heart disease Severe, low gradient, calcific aortic stenosis. Patient declines further evaluation.  CKD (chronic kidney disease) stage 3, GFR 30-59 ml/min Recent creatinine 1.6.    Richard SidleSamuel G. Camar [name], M.D., F.A.C.C.

## 2013-07-16 NOTE — Assessment & Plan Note (Signed)
Severe, low gradient, calcific aortic stenosis. Patient declines further evaluation. 

## 2013-08-24 ENCOUNTER — Encounter (HOSPITAL_COMMUNITY): Payer: Self-pay | Admitting: Emergency Medicine

## 2013-08-24 ENCOUNTER — Emergency Department (HOSPITAL_COMMUNITY)
Admission: EM | Admit: 2013-08-24 | Discharge: 2013-08-24 | Disposition: A | Discharge status: Home / Self Care | Payer: Medicare Other | Attending: Emergency Medicine | Admitting: Emergency Medicine

## 2013-08-24 ENCOUNTER — Emergency Department (HOSPITAL_COMMUNITY): Payer: Medicare Other

## 2013-08-24 DIAGNOSIS — Z8719 Personal history of other diseases of the digestive system: Secondary | ICD-10-CM | POA: Diagnosis not present

## 2013-08-24 DIAGNOSIS — Z79899 Other long term (current) drug therapy: Secondary | ICD-10-CM | POA: Insufficient documentation

## 2013-08-24 DIAGNOSIS — Z87891 Personal history of nicotine dependence: Secondary | ICD-10-CM | POA: Diagnosis not present

## 2013-08-24 DIAGNOSIS — E119 Type 2 diabetes mellitus without complications: Secondary | ICD-10-CM | POA: Insufficient documentation

## 2013-08-24 DIAGNOSIS — Z85828 Personal history of other malignant neoplasm of skin: Secondary | ICD-10-CM | POA: Insufficient documentation

## 2013-08-24 DIAGNOSIS — I2510 Atherosclerotic heart disease of native coronary artery without angina pectoris: Secondary | ICD-10-CM | POA: Insufficient documentation

## 2013-08-24 DIAGNOSIS — I498 Other specified cardiac arrhythmias: Secondary | ICD-10-CM | POA: Diagnosis present

## 2013-08-24 DIAGNOSIS — I252 Old myocardial infarction: Secondary | ICD-10-CM | POA: Insufficient documentation

## 2013-08-24 DIAGNOSIS — Z862 Personal history of diseases of the blood and blood-forming organs and certain disorders involving the immune mechanism: Secondary | ICD-10-CM | POA: Insufficient documentation

## 2013-08-24 DIAGNOSIS — L97909 Non-pressure chronic ulcer of unspecified part of unspecified lower leg with unspecified severity: Secondary | ICD-10-CM | POA: Insufficient documentation

## 2013-08-24 DIAGNOSIS — I10 Essential (primary) hypertension: Secondary | ICD-10-CM | POA: Insufficient documentation

## 2013-08-24 DIAGNOSIS — R001 Bradycardia, unspecified: Secondary | ICD-10-CM

## 2013-08-24 DIAGNOSIS — Z7982 Long term (current) use of aspirin: Secondary | ICD-10-CM | POA: Diagnosis not present

## 2013-08-24 DIAGNOSIS — Z794 Long term (current) use of insulin: Secondary | ICD-10-CM | POA: Diagnosis not present

## 2013-08-24 DIAGNOSIS — I4891 Unspecified atrial fibrillation: Secondary | ICD-10-CM | POA: Insufficient documentation

## 2013-08-24 DIAGNOSIS — Z87448 Personal history of other diseases of urinary system: Secondary | ICD-10-CM | POA: Insufficient documentation

## 2013-08-24 LAB — BASIC METABOLIC PANEL
BUN: 28 mg/dL — AB (ref 6–23)
CALCIUM: 9.6 mg/dL (ref 8.4–10.5)
CO2: 26 mEq/L (ref 19–32)
Chloride: 95 mEq/L — ABNORMAL LOW (ref 96–112)
Creatinine, Ser: 1.55 mg/dL — ABNORMAL HIGH (ref 0.50–1.35)
GFR calc Af Amer: 47 mL/min — ABNORMAL LOW (ref >90)
GFR, EST NON AFRICAN AMERICAN: 41 mL/min — AB (ref >90)
Glucose, Bld: 110 mg/dL — ABNORMAL HIGH (ref 70–99)
POTASSIUM: 5.2 meq/L (ref 3.7–5.3)
Sodium: 135 mEq/L — ABNORMAL LOW (ref 137–147)

## 2013-08-24 LAB — CBC WITH DIFFERENTIAL/PLATELET
BASOS ABS: 0 10*3/uL (ref 0.0–0.1)
BASOS PCT: 0 % (ref 0–1)
EOS ABS: 0.3 10*3/uL (ref 0.0–0.7)
Eosinophils Relative: 3 % (ref 0–5)
HCT: 36.2 % — ABNORMAL LOW (ref 39.0–52.0)
Hemoglobin: 11.4 g/dL — ABNORMAL LOW (ref 13.0–17.0)
Lymphocytes Relative: 22 % (ref 12–46)
Lymphs Abs: 1.6 10*3/uL (ref 0.7–4.0)
MCH: 27.9 pg (ref 26.0–34.0)
MCHC: 31.5 g/dL (ref 30.0–36.0)
MCV: 88.7 fL (ref 78.0–100.0)
MONOS PCT: 11 % (ref 3–12)
Monocytes Absolute: 0.8 10*3/uL (ref 0.1–1.0)
NEUTROS ABS: 4.6 10*3/uL (ref 1.7–7.7)
NEUTROS PCT: 64 % (ref 43–77)
PLATELETS: 158 10*3/uL (ref 150–400)
RBC: 4.08 MIL/uL — ABNORMAL LOW (ref 4.22–5.81)
RDW: 17 % — ABNORMAL HIGH (ref 11.5–15.5)
WBC: 7.3 10*3/uL (ref 4.0–10.5)

## 2013-08-24 LAB — TROPONIN I

## 2013-08-24 NOTE — ED Notes (Signed)
Received report on pt, pt lying semi fowler's on stretcher, alter, oriented, able to answer questions, , states " i did not even know that I was coming here until I had my blood sugar checked this am", comfort measures provided,

## 2013-08-24 NOTE — ED Notes (Signed)
Pt assisted to restroom with use of wheelchair, tolerated well,

## 2013-08-24 NOTE — ED Notes (Signed)
[facility] here to transport pt,  

## 2013-08-24 NOTE — ED Notes (Addendum)
Attempted to contact [facility] at [phone] numerous times with no answer, was able to leave voice message with no response,  [facility] contacted to verify any additional phone numbers and  transport to facility, [phone] was only phone number listed for [facility] as well,

## 2013-08-24 NOTE — ED Notes (Signed)
Pt to department via EMS.  [facility]ident of [facility].  Per report, pt has had a heart rate in the 40's since "the beginning of May."   Pt denies pain.  Reports mild SOB. Denies nausea or other concerns.  Per EMS, the nurse at the facility "just wanted him checked out."

## 2013-08-24 NOTE — ED Notes (Signed)
Pt updated on plan of care,  

## 2013-08-24 NOTE — Discharge Instructions (Signed)
Mr [name] is stable.   His pulse is 49.   No evidence of a myocardial infarction.   He can followup with his primary care Dr. or cardiologist of choice

## 2013-08-24 NOTE — ED Provider Notes (Signed)
CSN: [id number]     Arrival date & time 08/24/13  [id number] History  This chart was scribed for Donnetta HutchingBrian Gautham Hewins, MD by Karle PlumberJennifer Tensley, ED Scribe. This patient was seen in room APA02/APA02 and the patient's care was started at 7:19 AM.  Chief Complaint  Patient presents with  . Bradycardia   The history is provided by the patient, the nursing home and the EMS personnel. No language interpreter was used.   HPI Comments:  [name] is a 80 y.o. male with h/o DM, HTN and MI, [facility], who presents to the Emergency Department with "bradycardia" for several weeks. [facility] report that pt has been bradycardic for the past 6 weeks. He reports associated congestion with productive cough of phlegm. Pt denies CP, pain, nausea, fever, chills, rusty sputum. Pt states he is not ambulatory and is wheelchair bound. Severity is mild. Past Medical History  Diagnosis Date  . Essential hypertension, benign   . Type 2 diabetes mellitus   . Coronary atherosclerosis of native coronary artery     Presumed and managed conservatively  . History of GI diverticular bleed   . History of gastric ulcer   . History of skin cancer   . Permanent atrial fibrillation   . NSTEMI (non-ST elevated myocardial infarction)   . Ischemic cardiomyopathy     LVEF 30-35%  . Orthostatic hypotension   . Acute kidney failure   . Anemia    Past Surgical History  Procedure Laterality Date  . Abdominal surgery     Family History  Problem Relation Age of Onset  . Hypertension Father    History  Substance Use Topics  . Smoking status: Former Smoker    Quit date: 03/07/1987  . Smokeless tobacco: Not on file  . Alcohol Use: No    Review of Systems A complete 10 system review of systems was obtained and all systems are negative except as noted in the HPI and PMH.   Allergies  Review of patient's allergies indicates no known allergies.  Home Medications   Prior to Admission  medications   Medication Sig Start Date End Date Taking? Authorizing Provider  acetaminophen (TYLENOL) 650 MG CR tablet Take 650 mg by mouth every 4 (four) hours as needed for pain or fever (For temp of 101 or higher.  6 dose max in 24 hours).     Historical Provider, MD  albuterol (PROVENTIL) (2.5 MG/3ML) 0.083% nebulizer solution Take 2.5 mg by nebulization every 6 (six) hours as needed for wheezing.    Historical Provider, MD  amiodarone (PACERONE) 200 MG tablet Take 1 tablet (200 mg total) by mouth 2 (two) times daily. 06/20/13   Catarina Hartshornavid Tat, MD  aspirin EC 325 MG EC tablet Take 1 tablet (325 mg total) by mouth daily. 06/20/13   Catarina Hartshornavid Tat, MD  atorvastatin (LIPITOR) 80 MG tablet Take 1 tablet (80 mg total) by mouth daily at 6 PM. 06/20/13   Catarina Hartshornavid Tat, MD  cyanocobalamin (,VITAMIN B-12,) 1000 MCG/ML injection Inject 1,000 mcg into the skin every 30 (thirty) days.  05/06/12   Historical Provider, MD  furosemide (LASIX) 40 MG tablet Take 40 mg by mouth daily.    Historical Provider, MD  insulin aspart (NOVOLOG) 100 UNIT/ML injection Inject 0-12 Units into the skin daily. Novolog 0-12 units at 6am and 4pm Sliding scale and Novolog 10 units Q 5:30 pm ([facility])    Historical Provider,  MD  insulin glargine (LANTUS) 100 UNIT/ML injection Inject 15-60 Units into the skin 2 (two) times daily. Patient takes 15 units at bedtime and 60 units at 0800    Historical Provider, MD  ipratropium-albuterol (DUONEB) 0.5-2.5 (3) MG/3ML SOLN Take 3 mLs by nebulization 2 (two) times daily.     Historical Provider, MD  isosorbide mononitrate (IMDUR) 30 MG 24 hr tablet Take 1 tablet (30 mg total) by mouth daily. 06/20/13   Catarina Hartshorn, MD  metoprolol tartrate (LOPRESSOR) 25 MG tablet Take 1 tablet (25 mg total) by mouth 2 (two) times daily. 06/20/13   Catarina Hartshorn, MD  Multiple Vitamin (MULTIVITAMIN) tablet Take 1 tablet by mouth daily.    Historical Provider, MD  polyethylene glycol (MIRALAX / GLYCOLAX)  packet Take 17 g by mouth daily.    Historical Provider, MD  psyllium (HYDROCIL/METAMUCIL) 95 % PACK Take 1 packet by mouth 2 (two) times daily.    Historical Provider, MD  traMADol (ULTRAM) 50 MG tablet Take 50 mg by mouth 3 (three) times daily as needed for moderate pain.     Historical Provider, MD   Triage Vitals: BP 139/71  Pulse 46  Temp(Src) 98.6 F (37 C) (Oral)  Resp 16  Ht 6\' 1"  (1.854 m)  Wt 250 lb (113.399 kg)  BMI 32.99 kg/m2  SpO2 99% Physical Exam  Nursing note and vitals reviewed. Constitutional: He is oriented to person, place, and time. He appears well-developed and well-nourished.  HENT:  Head: Normocephalic and atraumatic.  Eyes: Conjunctivae and EOM are normal. Pupils are equal, round, and reactive to light.  Neck: Normal range of motion. Neck supple.  Cardiovascular: Regular rhythm and normal heart sounds.  Bradycardia present.   Pulmonary/Chest: Effort normal and breath sounds normal.  Abdominal: Soft. Bowel sounds are normal.  Musculoskeletal: Normal range of motion.  Neurological: He is alert and oriented to person, place, and time.  Skin: Skin is warm and dry.  Venous stasis ulcers on BLE.  Psychiatric: He has a normal mood and affect. His behavior is normal.    ED Course  Procedures (including critical care time) DIAGNOSTIC STUDIES: Oxygen Saturation is 99% on RA, normal by my interpretation.   COORDINATION OF CARE: 7:24 AM- Will order lab work. Pt verbalizes understanding and agrees to plan.  Medications - No data to display  Labs Review Labs Reviewed  BASIC METABOLIC PANEL - Abnormal; Notable for the following:    Sodium 135 (*)    Chloride 95 (*)    Glucose, Bld 110 (*)    BUN 28 (*)    Creatinine, Ser 1.55 (*)    GFR calc non Af Amer 41 (*)    GFR calc Af Amer 47 (*)    All other components within normal limits  CBC WITH DIFFERENTIAL - Abnormal; Notable for the following:    RBC 4.08 (*)    Hemoglobin 11.4 (*)    HCT 36.2 (*)     RDW 17.0 (*)    All other components within normal limits  TROPONIN I    Imaging Review Dg Chest 2 View  08/24/2013   CLINICAL DATA:  BRADYCARDIA  EXAM: CHEST  2 VIEW  COMPARISON:  Portable chest radiograph 06/17/2013  FINDINGS: Stable cardiomegaly. Atherosclerotic calcifications in the aorta. Lungs are clear. No acute osseous abnormalities. Superior subluxation of the humeral heads raise concern for rotator cuff injury. Mild osteoarthritic changes in the shoulders.  IMPRESSION: Stable cardiomegaly no acute cardiopulmonary disease.   Electronically Signed  By: Salome HolmesHector  Cooper M.D.   On: 08/24/2013 08:14     EKG Interpretation None      MDM   Final diagnoses:  Bradycardia   Patient is hemodynamically stable. Blood pressure good. His pulse has been approximately 50. He does not have chest pain.  He is wondering why he is here. Patient to follow up with his primary care Dr.   I personally performed the services described in this documentation, which was scribed in my presence. The recorded information has been reviewed and is accurate.    Donnetta HutchingBrian Paiton Boultinghouse, MD 08/24/13 [phone]

## 2013-08-24 NOTE — ED Notes (Signed)
Pt resting with eyes closed, resp even and non labored, will arouse when spoken to,

## 2013-08-24 NOTE — ED Notes (Signed)
Was able to get phone call through to [facility], spoke with [place] who advised that they did not have transportation today and to have ems transport pt to them, report also given to [place],

## 2013-08-24 NOTE — ED Notes (Signed)
Dr Cook at bedside,  

## 2013-08-24 NOTE — ED Notes (Signed)
Spoke with Lab who advised that pt's bmet had to be rerun and is running now,

## 2013-08-28 ENCOUNTER — Ambulatory Visit (INDEPENDENT_AMBULATORY_CARE_PROVIDER_SITE_OTHER): Payer: Medicare Other | Admitting: Cardiology

## 2013-08-28 ENCOUNTER — Other Ambulatory Visit: Payer: Self-pay | Admitting: *Deleted

## 2013-08-28 ENCOUNTER — Encounter: Payer: Self-pay | Admitting: Cardiology

## 2013-08-28 VITALS — BP 145/72 | HR 63 | Ht 72.0 in | Wt 252.0 lb

## 2013-08-28 DIAGNOSIS — N183 Chronic kidney disease, stage 3 unspecified: Secondary | ICD-10-CM

## 2013-08-28 DIAGNOSIS — I498 Other specified cardiac arrhythmias: Secondary | ICD-10-CM

## 2013-08-28 DIAGNOSIS — I4891 Unspecified atrial fibrillation: Secondary | ICD-10-CM

## 2013-08-28 DIAGNOSIS — I5023 Acute on chronic systolic (congestive) heart failure: Secondary | ICD-10-CM

## 2013-08-28 DIAGNOSIS — R001 Bradycardia, unspecified: Secondary | ICD-10-CM

## 2013-08-28 DIAGNOSIS — I2589 Other forms of chronic ischemic heart disease: Secondary | ICD-10-CM

## 2013-08-28 DIAGNOSIS — I509 Heart failure, unspecified: Secondary | ICD-10-CM

## 2013-08-28 NOTE — Progress Notes (Signed)
Clinical Summary [name] is a 80 y.o.male last seen by Dr Diona Browner, this is our first visit. He is seen today as a hospital follow up appointment.   1. DOE - hx of chronic systolic heart failure LVEF 35-40%.  - symptoms of DOE have progressed over the last few months - Increased LE edema, no orthopnea. No chest pain. No palpitations.  - from notes appears he has completed a course of antibiotics without much improvement for his SOB - nursing home increased lasix 40mg  bid just a few days ago  2. Bradycardia - recent ER visit at [facility] 08/24/13 with bradycardia. Had been noted at the [place] [facility] for several weeks. HR in ER 46, bp 139/71. The patient did not describe any symptoms. EKG showed afib rate 49. Discharged home.  - he denies any lightheadedness or dizziness. . Lopressor stopped at nursing home, heart rate today 63. Remains on amio 200mg  bid. Not on anticoag according to notes due to prior GI bleed.  Past Medical History  Diagnosis Date  . Essential hypertension, benign   . Type 2 diabetes mellitus   . Coronary atherosclerosis of native coronary artery     Presumed and managed conservatively  . History of GI diverticular bleed   . History of gastric ulcer   . History of skin cancer   . Permanent atrial fibrillation   . NSTEMI (non-ST elevated myocardial infarction)   . Ischemic cardiomyopathy     LVEF 30-35%  . Orthostatic hypotension   . Acute kidney failure   . Anemia      No Known Allergies   Current Outpatient Prescriptions  Medication Sig Dispense Refill  . acetaminophen (TYLENOL) 650 MG CR tablet Take 650 mg by mouth every 4 (four) hours as needed for pain or fever (For temp of 101 or higher.  6 dose max in 24 hours).       Marland Kitchen albuterol (PROVENTIL) (2.5 MG/3ML) 0.083% nebulizer solution Take 2.5 mg by nebulization every 6 (six) hours as needed for wheezing.      Marland Kitchen amiodarone (PACERONE) 200 MG tablet Take 1 tablet (200 mg total) by mouth 2 (two) times  daily.  60 tablet  1  . aspirin EC 325 MG EC tablet Take 1 tablet (325 mg total) by mouth daily.  30 tablet  0  . atorvastatin (LIPITOR) 80 MG tablet Take 1 tablet (80 mg total) by mouth daily at 6 PM.  30 tablet  0  . cyanocobalamin (,VITAMIN B-12,) 1000 MCG/ML injection Inject 1,000 mcg into the skin every 30 (thirty) days.       . furosemide (LASIX) 40 MG tablet Take 40 mg by mouth daily.      . insulin aspart (NOVOLOG) 100 UNIT/ML injection Inject 0-12 Units into the skin daily. Novolog 0-12 units at 6am and 4pm Sliding scale and Novolog 10 units Q 5:30 pm (verified with Nurse at [facility])      . insulin glargine (LANTUS) 100 UNIT/ML injection Inject 15-60 Units into the skin 2 (two) times daily. Patient takes 15 units at bedtime and 60 units at 0800      . ipratropium-albuterol (DUONEB) 0.5-2.5 (3) MG/3ML SOLN Take 3 mLs by nebulization 2 (two) times daily.       . isosorbide mononitrate (IMDUR) 30 MG 24 hr tablet Take 1 tablet (30 mg total) by mouth daily.  30 tablet  0  . metoprolol tartrate (LOPRESSOR) 25 MG tablet Take 1 tablet (25 mg total)  by mouth 2 (two) times daily.  60 tablet  0  . Multiple Vitamin (MULTIVITAMIN) tablet Take 1 tablet by mouth daily.      . polyethylene glycol (MIRALAX / GLYCOLAX) packet Take 17 g by mouth daily.      . psyllium (HYDROCIL/METAMUCIL) 95 % PACK Take 1 packet by mouth 2 (two) times daily.      . traMADol (ULTRAM) 50 MG tablet Take 50 mg by mouth 3 (three) times daily as needed for moderate pain.        No current facility-administered medications for this visit.     Past Surgical History  Procedure Laterality Date  . Abdominal surgery       No Known Allergies    Family History  Problem Relation Age of Onset  . Hypertension Father      Social History [name] reports that he quit smoking about 26 years ago. He does not have any smokeless tobacco history on file. [name] reports that he does not drink alcohol.   Review of  Systems CONSTITUTIONAL: No weight loss, fever, chills, weakness or fatigue.  HEENT: Eyes: No visual loss, blurred vision, double vision or yellow sclerae.No hearing loss, sneezing, congestion, runny nose or sore throat.  SKIN: No rash or itching.  CARDIOVASCULAR: per HPI RESPIRATORY: +SOB GASTROINTESTINAL: No anorexia, nausea, vomiting or diarrhea. No abdominal pain or blood.  GENITOURINARY: No burning on urination, no polyuria NEUROLOGICAL: No headache, dizziness, syncope, paralysis, ataxia, numbness or tingling in the extremities. No change in bowel or bladder control.  MUSCULOSKELETAL: No muscle, back pain, joint pain or stiffness.  LYMPHATICS: No enlarged nodes. No history of splenectomy.  PSYCHIATRIC: No history of depression or anxiety.  ENDOCRINOLOGIC: No reports of sweating, cold or heat intolerance. No polyuria or polydipsia.  Marland Kitchen.   Physical Examination p 63 bp 145/72 Wt 252 lbs BMI 34 Gen: resting comfortably, no acute distress HEENT: no scleral icterus, pupils equal round and reactive, no palptable cervical adenopathy,  CV: irreg, rate 60, no m/r/g. Resp: Clear to auscultation bilaterally GI: abdomen is soft, non-tender, non-distended, normal bowel sounds, no hepatosplenomegaly MSK: extremities are warm, 1+bilateral edema  Skin: warm, no rash Neuro:  no focal deficits Psych: appropriate affect   Diagnostic Studies 06/2013 Echo Study Conclusions  - Left ventricle: The cavity size was normal. Wall thickness was increased in a pattern of moderate LVH. Systolic function was moderately reduced. The estimated ejection fraction was in the range of 35% to 40%. Diffuse hypokinesis. There is severe hypokinesis of the inferolateral myocardium. Doppler parameters are consistent with restrictive physiology, indicative of decreased left ventricular diastolic compliance and/or increased left atrial pressure. - Aortic valve: Moderately calcified annulus. Trileaflet; moderately  calcified leaflets. Trivial regurgitation. Mean gradient: 10mm Hg (S). Valve area: 1.02cm^2(VTI). Peak velocity ratio of LVOT to aortic valve: 0.32. Valve area: 1cm^2 (Vmax). - Mitral valve: Mildly thickened leaflets . Moderate regurgitation. Regurgitant volume: 21ml (PISA). - Left atrium: The atrium was severely dilated. - Right ventricle: The cavity size was moderately dilated. Systolic function was moderately reduced. - Right atrium: The atrium was moderately dilated. Central venous pressure: 15mm Hg (est). - Atrial septum: No defect or patent foramen ovale was identified. - Tricuspid valve: Moderate regurgitation. - Pulmonary arteries: Systolic pressure was moderately to severely increased. PA peak pressure: 62mm Hg (S). - Pericardium, extracardiac: A small pericardial effusion was identified posterior to the heart. Impressions:  - Normal chamber size with moderate LVH and LVEF 35-40%, restrictive diastolic filling pattern. Severe  left atrial enlargement. Thickened mitral valve with moderate regurgitation. Probable low gradient, severe calcific aortic stenosis with trivial regurgitation (could be further elucidated with dobutamiine echocardiogram if clinically indicated). Moderate RV dilatation with reduced function. Moderate tricuspid regurgitation witn PASP 62 mmHg and elevated CVP. Small posterior pericardial effusion.      Assessment and Plan  1. DOE - secondary to acute on chronic systolic heart failure, his diuretic was just recently increased, agree with lasix 40mg  bid with a BMET in 1 week  2. Bradycardia - rates improved since lopressor was stopped, patient denies any symptoms. No palpitations since being off beta blocker in setting of afib, remains on amio - HR normalized off beta blocker, continue follow clinically   F/u 3 weeks with primary cardiologist Dr Diona BrownerMcDowell   Antoine PocheJonathan F. Branch, M.D., F.A.C.C.

## 2013-08-28 NOTE — Patient Instructions (Signed)
Your physician recommends that you schedule a follow-up appointment in: 3 weeks with Dr. Diona BrownerMcDowell. Your physician recommends that you continue on your current medications as directed. Please refer to the Current Medication list given to you today. Your physician recommends that you have lab work in 1 week to check your BMET.

## 2013-09-04 ENCOUNTER — Encounter: Payer: Self-pay | Admitting: Cardiology

## 2013-09-19 ENCOUNTER — Ambulatory Visit (INDEPENDENT_AMBULATORY_CARE_PROVIDER_SITE_OTHER): Payer: Medicare Other | Admitting: Cardiology

## 2013-09-19 ENCOUNTER — Encounter: Payer: Self-pay | Admitting: Cardiology

## 2013-09-19 VITALS — BP 133/73 | HR 53 | Ht 73.0 in | Wt 236.8 lb

## 2013-09-19 DIAGNOSIS — N183 Chronic kidney disease, stage 3 unspecified: Secondary | ICD-10-CM

## 2013-09-19 DIAGNOSIS — I4891 Unspecified atrial fibrillation: Secondary | ICD-10-CM

## 2013-09-19 DIAGNOSIS — I480 Paroxysmal atrial fibrillation: Secondary | ICD-10-CM

## 2013-09-19 DIAGNOSIS — I38 Endocarditis, valve unspecified: Secondary | ICD-10-CM

## 2013-09-19 DIAGNOSIS — I255 Ischemic cardiomyopathy: Secondary | ICD-10-CM

## 2013-09-19 DIAGNOSIS — I2589 Other forms of chronic ischemic heart disease: Secondary | ICD-10-CM

## 2013-09-19 NOTE — Assessment & Plan Note (Signed)
Being managed conservatively at patient request. LVEF 35-40% by most recent echocardiogram. Currently not on ACE inhibitor or ARB with history of renal insufficiency and hyperkalemia. 

## 2013-09-19 NOTE — Progress Notes (Signed)
Clinical Summary Mr. Batson is an 80 y.o.male most recently seen in the office by Dr. Wyline Mood in June. Record review finds ER evaluation and [facility] due to chest pain in early July. He signed out AGAINST MEDICAL ADVICE at that time.  Recent lab work in July showed BUN 26, creatinine 2.0, normal AST and ALT, sodium 133, potassium 4.8, troponin I 0.18, BNP 1274, hemoglobin 11.7, platelets 241. Chest x-ray from June 2 reported cardiac enlargement with vascular congestion and bronchitic changes.  He has history of ischemic cardiomyopathy that is being managed conservatively at his request without further invasive or device workup. It has been noted that he tends to be symptomatic with rapid atrial arrhythmias, which is why he is on amiodarone. He is not anticoagulated with history of recurrent bleeding.  Followup echocardiogram in April showed moderate LVH with LVEF 35-40%, restrictive diastolic filling pattern, severe left atrial enlargement, thickened mitral valve with moderate regurgitation, probable low gradient severe calcific aortic stenosis with trivial regurgitation, moderate RV dilatation with reduced contraction, moderate tricuspid regurgitation with PASP 62 mmHg and elevated CVP, small posterior pericardial effusion.  He is here with assistant from his living facility today. He tells me that he has been doing okay in general, chronically ill at baseline. He does not endorse any interval chest pain or palpitations since his most recent ER visit. I agree with his being taken off of beta blocker given documented heart rates in the 40s, however would try and continue amiodarone at current dose since most of his symptomatology is related to related to rapid arrhythmias.   No Known Allergies  Current Outpatient Prescriptions  Medication Sig Dispense Refill  . acetaminophen (TYLENOL) 325 MG tablet Take 650 mg by mouth every 4 (four) hours as needed.      Marland Kitchen amiodarone (PACERONE) 200 MG tablet  Take 1 tablet (200 mg total) by mouth 2 (two) times daily.  60 tablet  1  . aspirin EC 325 MG EC tablet Take 1 tablet (325 mg total) by mouth daily.  30 tablet  0  . atorvastatin (LIPITOR) 80 MG tablet Take 1 tablet (80 mg total) by mouth daily at 6 PM.  30 tablet  0  . cyanocobalamin (,VITAMIN B-12,) 1000 MCG/ML injection Inject 1,000 mcg into the skin every 30 (thirty) days.       . furosemide (LASIX) 40 MG tablet Take 40 mg by mouth 2 (two) times daily.       . insulin aspart (NOVOLOG) 100 UNIT/ML injection Inject 10 Units into the skin daily. Novolog 0-12 units at 6am and 4pm Sliding scale and Novolog 10 units Q 5:30 pm (verified with Nurse at [facility])      . insulin glargine (LANTUS) 100 UNIT/ML injection Inject 60 Units into the skin daily. Patient takes 15 units at bedtime and 60 units at 0800      . ipratropium-albuterol (DUONEB) 0.5-2.5 (3) MG/3ML SOLN Take 3 mLs by nebulization 2 (two) times daily.       . isosorbide mononitrate (IMDUR) 30 MG 24 hr tablet Take 1 tablet (30 mg total) by mouth daily.  30 tablet  0  . Multiple Vitamin (MULTIVITAMIN) tablet Take 1 tablet by mouth daily.      . nitroGLYCERIN (NITROSTAT) 0.4 MG SL tablet Place 0.4 mg under the tongue every 5 (five) minutes as needed for chest pain.      . polyethylene glycol (MIRALAX / GLYCOLAX) packet Take 17 g by mouth daily.      Marland Kitchen  psyllium (HYDROCIL/METAMUCIL) 95 % PACK Take 1 packet by mouth 2 (two) times daily.       No current facility-administered medications for this visit.    Past Medical History  Diagnosis Date  . Essential hypertension, benign   . Type 2 diabetes mellitus   . Coronary atherosclerosis of native coronary artery     Presumed and managed conservatively  . History of GI diverticular bleed   . History of gastric ulcer   . History of skin cancer   . Permanent atrial fibrillation   . NSTEMI (non-ST elevated myocardial infarction)   . Ischemic cardiomyopathy     LVEF 30-35%  . Orthostatic  hypotension   . Acute kidney failure   . Anemia     Social History Mr. Karna DupesGrogan reports that he quit smoking about 26 years ago. He has never used smokeless tobacco. Mr. Karna DupesGrogan reports that he does not drink alcohol.  Review of Systems No fevers or chills, no cough, functionally limited at baseline, uses a wheelchair most of the time. Other systems reviewed and negative except as outlined.  Physical Examination Filed Vitals:   09/19/13 1101  BP: 133/73  Pulse: 53   Filed Weights   09/19/13 1101  Weight: 236 lb 12.8 oz (107.412 kg)    Chronically ill-appearing male in no acute distress. In wheelchair.  HEENT: Conjunctiva and lids normal, oropharynx clear with poor dentition.  Neck: Supple, no elevated JVP bilateral carotid bruits, no thyromegaly.  Lungs: Diminished but cear to auscultation, nonlabored breathing at rest.  Cardiac: Regular rate and rhythm, no S3, 2/6 systolic murmur, no pericardial rub.  Abdomen: Soft, nontender, bowel sounds present.  Extremities: 1+ edema, distal pulses 1+.  Skin: Warm and dry.  Musculoskeletal: No kyphosis.  Neuropsychiatric: Alert and oriented x3, affect grossly appropriate.   Problem List and Plan   Atrial fibrillation Also ectopic atrial tachycardia/flutter. Would continue amiodarone at current dose, agree with stopping beta blocker given documented bradycardia in the 40s. He is a poor anticoagulation candidate as has already been outlined, and is on aspirin.  Cardiomyopathy, ischemic Being managed conservatively at patient request. LVEF 35-40% by most recent echocardiogram. Currently not on ACE inhibitor or ARB with history of renal insufficiency and hyperkalemia.  Valvular heart disease Severe, low gradient, calcific aortic stenosis. Patient declines further evaluation.  CKD (chronic kidney disease) stage 3, GFR 30-59 ml/min Recent creatinine 2.0.    Jonelle SidleSamuel G. McDowell, M.D., F.A.C.C.

## 2013-09-19 NOTE — Patient Instructions (Signed)
Your physician recommends that you schedule a follow-up appointment in: 3 months. You will receive a reminder letter in the mail in about 1-2 months reminding you to call and schedule your appointment. If you don't receive this letter, please contact our office. Your physician recommends that you continue on your current medications as directed. Please refer to the Current Medication list given to you today. 

## 2013-09-19 NOTE — Assessment & Plan Note (Signed)
Severe, low gradient, calcific aortic stenosis. Patient declines further evaluation. 

## 2013-09-19 NOTE — Assessment & Plan Note (Signed)
Also ectopic atrial tachycardia/flutter. Would continue amiodarone at current dose, agree with stopping beta blocker given documented bradycardia in the 40s. He is a poor anticoagulation candidate as has already been outlined, and is on aspirin.

## 2013-09-19 NOTE — Assessment & Plan Note (Signed)
Recent creatinine 2.0. 

## 2013-12-18 ENCOUNTER — Encounter: Payer: Self-pay | Admitting: Cardiology

## 2013-12-18 ENCOUNTER — Ambulatory Visit (INDEPENDENT_AMBULATORY_CARE_PROVIDER_SITE_OTHER): Payer: Medicare Other | Admitting: Cardiology

## 2013-12-18 VITALS — BP 115/78 | HR 115 | Ht 73.0 in | Wt 249.0 lb

## 2013-12-18 DIAGNOSIS — N183 Chronic kidney disease, stage 3 unspecified: Secondary | ICD-10-CM

## 2013-12-18 DIAGNOSIS — Z5181 Encounter for therapeutic drug level monitoring: Secondary | ICD-10-CM

## 2013-12-18 DIAGNOSIS — I2589 Other forms of chronic ischemic heart disease: Secondary | ICD-10-CM

## 2013-12-18 DIAGNOSIS — I38 Endocarditis, valve unspecified: Secondary | ICD-10-CM

## 2013-12-18 DIAGNOSIS — I4891 Unspecified atrial fibrillation: Secondary | ICD-10-CM

## 2013-12-18 DIAGNOSIS — I255 Ischemic cardiomyopathy: Secondary | ICD-10-CM

## 2013-12-18 MED ORDER — PINDOLOL 5 MG PO TABS: 2.5000 mg | ORAL_TABLET | Freq: Two times a day (BID) | ORAL | Status: DC

## 2013-12-18 NOTE — Assessment & Plan Note (Signed)
Being managed conservatively at patient request. LVEF 35-40% by most recent echocardiogram. Currently not on ACE inhibitor or ARB with history of renal insufficiency and hyperkalemia.

## 2013-12-18 NOTE — Patient Instructions (Signed)
Your physician recommends that you schedule a follow-up appointment in: 3 months. You will receive a reminder letter in the mail in about 1-2 months reminding you to call and schedule your appointment. If you don't receive this letter, please contact our office. Your physician has recommended you make the following change in your medication:  Start pindolol 2.5 mg twice daily. Continue all other medications the same. Your physician recommends that you have lab work in 3 months just before your next visit to check your CMET and TSH.

## 2013-12-18 NOTE — Assessment & Plan Note (Signed)
Also history of atrial fibrillation and atrial tachycardia. We will continue amiodarone, try and add pindolol 2.5 mg twice daily. Might be limited by bradycardia, but hopefully he will tolerate and this will help symptoms. As noted previously, he is not anticoagulated with history of recurrent bleeding.

## 2013-12-18 NOTE — Assessment & Plan Note (Signed)
Severe, low gradient, calcific aortic stenosis. Patient declines further evaluation.

## 2013-12-18 NOTE — Assessment & Plan Note (Signed)
Recent creatinine 1.6. 

## 2013-12-18 NOTE — Progress Notes (Signed)
Clinical Summary [name] is an 80 y.o.male last seen in July. He continues to reside in a nursing facility. Reports angina symptoms which we have been treating conservatively, on medical therapy. Reports symptom relief with sublingual nitroglycerin. Heart rate is elevated today as has been the case when he has been in atrial tachycardia and other atrial arrhythmias. He continues on amiodarone, no longer on Lopressor with prior bradycardia.  He has history of ischemic cardiomyopathy that is being managed conservatively at his request without further invasive or device workup. It has been noted that he tends to be symptomatic with rapid atrial arrhythmias, which is why he is on amiodarone. He is not anticoagulated with history of recurrent bleeding. Followup echocardiogram in April showed moderate LVH with LVEF 35-40%, restrictive diastolic filling pattern, severe left atrial enlargement, thickened mitral valve with moderate regurgitation, probable low gradient severe calcific aortic stenosis with trivial regurgitation, moderate RV dilatation with reduced contraction, moderate tricuspid regurgitation with PASP 62 mmHg and elevated CVP, small posterior pericardial effusion.  Patient is not aware of any rapid palpitations at this time. He states that he does not want to be hospitalized.  No Known Allergies  Current Outpatient Prescriptions  Medication Sig Dispense Refill  . acetaminophen (TYLENOL) 325 MG tablet Take 650 mg by mouth every 4 (four) hours as needed.      Marland Kitchen. amiodarone (PACERONE) 200 MG tablet Take 1 tablet (200 mg total) by mouth 2 (two) times daily.  60 tablet  1  . aspirin 81 MG tablet Take 81 mg by mouth daily.      Marland Kitchen. atorvastatin (LIPITOR) 80 MG tablet Take 1 tablet (80 mg total) by mouth daily at 6 PM.  30 tablet  0  . cyanocobalamin (,VITAMIN B-12,) 1000 MCG/ML injection Inject 1,000 mcg into the skin every 30 (thirty) days.       . furosemide (LASIX) 40 MG tablet Take 40 mg  by mouth 2 (two) times daily.       . insulin aspart (NOVOLOG) 100 UNIT/ML injection Inject 10 Units into the skin daily. Novolog 0-12 units at 6am and 4pm Sliding scale and Novolog 10 units Q 5:30 pm ([facility])      . insulin glargine (LANTUS) 100 UNIT/ML injection Inject 60 Units into the skin daily. Patient takes 15 units at bedtime and 60 units at 0800      . ipratropium-albuterol (DUONEB) 0.5-2.5 (3) MG/3ML SOLN Take 3 mLs by nebulization 2 (two) times daily.       . isosorbide mononitrate (IMDUR) 30 MG 24 hr tablet Take 1 tablet (30 mg total) by mouth daily.  30 tablet  0  . Multiple Vitamin (MULTIVITAMIN) tablet Take 1 tablet by mouth daily.      . nitroGLYCERIN (NITROSTAT) 0.4 MG SL tablet Place 0.4 mg under the tongue every 5 (five) minutes as needed for chest pain.      . polyethylene glycol (MIRALAX / GLYCOLAX) packet Take 17 g by mouth daily.      . psyllium (HYDROCIL/METAMUCIL) 95 % PACK Take 1 packet by mouth 2 (two) times daily.      . traMADol (ULTRAM) 50 MG tablet Take 50 mg by mouth 3 (three) times daily as needed.      . pindolol (VISKEN) 5 MG tablet Take 0.5 tablets (2.5 mg total) by mouth 2 (two) times daily.  30 tablet  3   No current facility-administered medications for this visit.    Past  Medical History  Diagnosis Date  . Essential hypertension, benign   . Type 2 diabetes mellitus   . Coronary atherosclerosis of native coronary artery     Presumed and managed conservatively  . History of GI diverticular bleed   . History of gastric ulcer   . History of skin cancer   . Permanent atrial fibrillation   . NSTEMI (non-ST elevated myocardial infarction)   . Ischemic cardiomyopathy     LVEF 30-35%  . Orthostatic hypotension   . Acute kidney failure   . Anemia     Social History [name] reports that he quit smoking about 26 years ago. He has never used smokeless tobacco. [name] reports that he does not drink alcohol.  Review of  Systems Other systems reviewed and negative.  Physical Examination Filed Vitals:   12/18/13 1025  BP: 115/78  Pulse: 115   Filed Weights   12/18/13 1025  Weight: 249 lb (112.946 kg)    Chronically ill-appearing male in no acute distress. In wheelchair.  HEENT: Conjunctiva and lids normal, oropharynx clear with poor dentition.  Neck: Supple, no elevated JVP bilateral carotid bruits, no thyromegaly.  Lungs: Diminished but cear to auscultation, nonlabored breathing at rest.  Cardiac: Rapid regular rate and rhythm, no S3, 2/6 systolic murmur, no pericardial rub.  Abdomen: Soft, nontender, bowel sounds present.  Extremities: 1+ edema, distal pulses 1+.  Skin: Warm and dry.  Musculoskeletal: No kyphosis.  Neuropsychiatric: Alert and oriented x3, affect grossly appropriate.   Problem List and Plan   Atrial fibrillation Also history of atrial fibrillation and atrial tachycardia. We will continue amiodarone, try and add pindolol 2.5 mg twice daily. Might be limited by bradycardia, but hopefully he will tolerate and this will help symptoms. As noted previously, he is not anticoagulated with history of recurrent bleeding.  Cardiomyopathy, ischemic Being managed conservatively at patient request. LVEF 35-40% by most recent echocardiogram. Currently not on ACE inhibitor or ARB with history of renal insufficiency and hyperkalemia.  CKD (chronic kidney disease) stage 3, GFR 30-59 ml/min Recent creatinine 1.6.  Valvular heart disease Severe, low gradient, calcific aortic stenosis. Patient declines further evaluation.    Jonelle SidleSamuel G. Emran [name], M.D., F.A.C.C.

## 2014-01-03 ENCOUNTER — Emergency Department (HOSPITAL_COMMUNITY): Payer: Medicare Other

## 2014-01-03 ENCOUNTER — Emergency Department (HOSPITAL_COMMUNITY)
Admission: EM | Admit: 2014-01-03 | Discharge: 2014-01-03 | Disposition: A | Discharge status: Skilled Nursing Facility | Payer: Medicare Other | Attending: Emergency Medicine | Admitting: Emergency Medicine

## 2014-01-03 ENCOUNTER — Encounter (HOSPITAL_COMMUNITY): Payer: Self-pay | Admitting: Emergency Medicine

## 2014-01-03 DIAGNOSIS — Z79899 Other long term (current) drug therapy: Secondary | ICD-10-CM | POA: Diagnosis not present

## 2014-01-03 DIAGNOSIS — R001 Bradycardia, unspecified: Secondary | ICD-10-CM

## 2014-01-03 DIAGNOSIS — Z794 Long term (current) use of insulin: Secondary | ICD-10-CM | POA: Diagnosis not present

## 2014-01-03 DIAGNOSIS — Z85828 Personal history of other malignant neoplasm of skin: Secondary | ICD-10-CM | POA: Diagnosis not present

## 2014-01-03 DIAGNOSIS — Z7982 Long term (current) use of aspirin: Secondary | ICD-10-CM | POA: Insufficient documentation

## 2014-01-03 DIAGNOSIS — Z8719 Personal history of other diseases of the digestive system: Secondary | ICD-10-CM | POA: Insufficient documentation

## 2014-01-03 DIAGNOSIS — E119 Type 2 diabetes mellitus without complications: Secondary | ICD-10-CM | POA: Diagnosis not present

## 2014-01-03 DIAGNOSIS — Z87891 Personal history of nicotine dependence: Secondary | ICD-10-CM | POA: Diagnosis not present

## 2014-01-03 DIAGNOSIS — I2510 Atherosclerotic heart disease of native coronary artery without angina pectoris: Secondary | ICD-10-CM | POA: Insufficient documentation

## 2014-01-03 DIAGNOSIS — I252 Old myocardial infarction: Secondary | ICD-10-CM | POA: Insufficient documentation

## 2014-01-03 DIAGNOSIS — R609 Edema, unspecified: Secondary | ICD-10-CM | POA: Insufficient documentation

## 2014-01-03 DIAGNOSIS — R531 Weakness: Secondary | ICD-10-CM

## 2014-01-03 DIAGNOSIS — D649 Anemia, unspecified: Secondary | ICD-10-CM | POA: Diagnosis not present

## 2014-01-03 DIAGNOSIS — I10 Essential (primary) hypertension: Secondary | ICD-10-CM | POA: Diagnosis not present

## 2014-01-03 DIAGNOSIS — Z87448 Personal history of other diseases of urinary system: Secondary | ICD-10-CM | POA: Diagnosis not present

## 2014-01-03 LAB — CBC WITH DIFFERENTIAL/PLATELET
Basophils Absolute: 0 10*3/uL (ref 0.0–0.1)
Basophils Relative: 0 % (ref 0–1)
Eosinophils Absolute: 0.1 10*3/uL (ref 0.0–0.7)
Eosinophils Relative: 2 % (ref 0–5)
HCT: 35.9 % — ABNORMAL LOW (ref 39.0–52.0)
Hemoglobin: 10.7 g/dL — ABNORMAL LOW (ref 13.0–17.0)
LYMPHS ABS: 0.9 10*3/uL (ref 0.7–4.0)
Lymphocytes Relative: 12 % (ref 12–46)
MCH: 28.2 pg (ref 26.0–34.0)
MCHC: 29.8 g/dL — ABNORMAL LOW (ref 30.0–36.0)
MCV: 94.7 fL (ref 78.0–100.0)
Monocytes Absolute: 0.8 10*3/uL (ref 0.1–1.0)
Monocytes Relative: 10 % (ref 3–12)
NEUTROS ABS: 5.5 10*3/uL (ref 1.7–7.7)
NEUTROS PCT: 76 % (ref 43–77)
Platelets: 212 10*3/uL (ref 150–400)
RBC: 3.79 MIL/uL — AB (ref 4.22–5.81)
RDW: 16.6 % — AB (ref 11.5–15.5)
WBC: 7.2 10*3/uL (ref 4.0–10.5)

## 2014-01-03 LAB — BASIC METABOLIC PANEL
Anion gap: 11 (ref 5–15)
BUN: 34 mg/dL — ABNORMAL HIGH (ref 6–23)
CALCIUM: 9 mg/dL (ref 8.4–10.5)
CHLORIDE: 102 meq/L (ref 96–112)
CO2: 29 meq/L (ref 19–32)
Creatinine, Ser: 1.96 mg/dL — ABNORMAL HIGH (ref 0.50–1.35)
GFR calc Af Amer: 35 mL/min — ABNORMAL LOW (ref >90)
GFR calc non Af Amer: 30 mL/min — ABNORMAL LOW (ref >90)
GLUCOSE: 80 mg/dL (ref 70–99)
POTASSIUM: 4.4 meq/L (ref 3.7–5.3)
Sodium: 142 mEq/L (ref 137–147)

## 2014-01-03 LAB — TROPONIN I

## 2014-01-03 MED ORDER — FUROSEMIDE 10 MG/ML IJ SOLN
40.0000 mg | Freq: Once | INTRAMUSCULAR | Status: AC
  Administered 2014-01-03: 40 mg via INTRAVENOUS
  Filled 2014-01-03: qty 4

## 2014-01-03 NOTE — ED Provider Notes (Signed)
CSN: [id number]     Arrival date & time 01/03/14  0721 History  This chart was scribed for [name] Richard Raygoza, MD by Roxy Cedarhandni Bhalodia, ED Scribe. This patient was seen in room APA02/APA02 and the patient's care was started at 7:36 AM.   Chief Complaint  Patient presents with  . Weakness   Patient is a 80 y.o. male presenting with weakness. The history is provided by the patient. No language interpreter was used.  Weakness   HPI Comments: [name] is a 80 y.o. male with a history of ischemic cardiomyopathy, CAD, who presents to the Emergency Department complaining of several days of constant unchanging generalized weakness. Patient states that he lives in nursing home. He denies associated fever,syncope, CP, new SOB, no lateral side weakness or numbness. When his beta blocker was last stopped his pulse improved from mid 40's to low 60's.  At cardiology office restarted beta blocker 2 weeks ago which had previously been discontinued due to bradycardia. Not on anticoagulation due to history of bleeding. EF 35% At baseline, [facility] and [facility]. Reports associated general SOB. No new SOB.    Past Medical History  Diagnosis Date  . Essential hypertension, benign   . Type 2 diabetes mellitus   . Coronary atherosclerosis of native coronary artery     Presumed and managed conservatively  . History of GI diverticular bleed   . History of gastric ulcer   . History of skin cancer   . Permanent atrial fibrillation   . NSTEMI (non-ST elevated myocardial infarction)   . Ischemic cardiomyopathy     LVEF 30-35%  . Orthostatic hypotension   . Acute kidney failure   . Anemia    Past Surgical History  Procedure Laterality Date  . Abdominal surgery     Family History  Problem Relation Age of Onset  . Hypertension Father    History  Substance Use Topics  . Smoking status: Former Smoker    Quit date: 03/07/1987  . Smokeless tobacco: Never Used  . Alcohol Use:  No   Review of Systems  Neurological: Positive for weakness.   10 Systems reviewed and are negative for acute change except as noted in the HPI.  Allergies  Review of patient's allergies indicates no known allergies.  Home Medications   Prior to Admission medications   Medication Sig Start Date End Date Taking? Authorizing Provider  acetaminophen (TYLENOL) 325 MG tablet Take 650 mg by mouth every 4 (four) hours as needed.    Historical Provider, MD  amiodarone (PACERONE) 200 MG tablet Take 1 tablet (200 mg total) by mouth 2 (two) times daily. 06/20/13   Catarina Hartshornavid Tat, MD  aspirin 81 MG tablet Take 81 mg by mouth daily.    Historical Provider, MD  atorvastatin (LIPITOR) 80 MG tablet Take 1 tablet (80 mg total) by mouth daily at 6 PM. 06/20/13   Catarina Hartshornavid Tat, MD  cyanocobalamin (,VITAMIN B-12,) 1000 MCG/ML injection Inject 1,000 mcg into the skin every 30 (thirty) days.  05/06/12   Historical Provider, MD  furosemide (LASIX) 40 MG tablet Take 40 mg by mouth 2 (two) times daily.     Historical Provider, MD  insulin aspart (NOVOLOG) 100 UNIT/ML injection Inject 10 Units into the skin daily. Novolog 0-12 units at 6am and 4pm Sliding scale and Novolog 10 units Q 5:30 pm ([facility])    Historical Provider, MD  insulin glargine (LANTUS) 100 UNIT/ML injection Inject 60  Units into the skin daily. Patient takes 15 units at bedtime and 60 units at 0800    Historical Provider, MD  ipratropium-albuterol (DUONEB) 0.5-2.5 (3) MG/3ML SOLN Take 3 mLs by nebulization 2 (two) times daily.     Historical Provider, MD  isosorbide mononitrate (IMDUR) 30 MG 24 hr tablet Take 1 tablet (30 mg total) by mouth daily. 06/20/13   Catarina Hartshorn, MD  Multiple Vitamin (MULTIVITAMIN) tablet Take 1 tablet by mouth daily.    Historical Provider, MD  nitroGLYCERIN (NITROSTAT) 0.4 MG SL tablet Place 0.4 mg under the tongue every 5 (five) minutes as needed for chest pain.    Historical Provider, MD  polyethylene glycol  (MIRALAX / GLYCOLAX) packet Take 17 g by mouth daily.    Historical Provider, MD  psyllium (HYDROCIL/METAMUCIL) 95 % PACK Take 1 packet by mouth 2 (two) times daily.    Historical Provider, MD  traMADol (ULTRAM) 50 MG tablet Take 50 mg by mouth 3 (three) times daily as needed.    Historical Provider, MD   Triage Vitals: BP 149/51  Pulse 50  Temp(Src) 97.5 F (36.4 C) (Oral)  Resp 22  Ht 6\' 1"  (1.854 [name])  Wt 248 lb (112.492 kg)  BMI 32.73 kg/m2  SpO2 98%  Physical Exam  Constitutional:  Awake, alert, nontoxic appearance.  HENT:  Head: Atraumatic.  Eyes: Right eye exhibits no discharge. Left eye exhibits no discharge.  Neck: Neck supple.  Cardiovascular: Regular rhythm.   No murmur heard. bradycardic  Pulmonary/Chest: Effort normal. No respiratory distress. He has no wheezes. He has rales. He exhibits no tenderness.  Few bibasilar crackles; normal room air pulse ox 100%  Abdominal: Soft. He exhibits no distension. There is no tenderness. There is no rebound and no guarding.  Musculoskeletal: He exhibits edema. He exhibits no tenderness.  Baseline ROM, no obvious new focal weakness. Baseline moderate edema lower legs.  Neurological:  Mental status and motor strength appears baseline for patient and situation.  Skin: No rash noted.  Psychiatric: He has a normal mood and affect.  Nursing note and vitals reviewed.  ED Course  Procedures (including critical care time)  DIAGNOSTIC STUDIES: Oxygen Saturation is 98% on [place], normal by my interpretation.    COORDINATION OF CARE: 7:44 AM- Pt advised of plan for treatment and pt agrees. Patient / Family / Caregiver understand and agree with initial ED impression and plan with expectations set for ED visit.  Labs Review Labs Reviewed  CBC WITH DIFFERENTIAL - Abnormal; Notable for the following:    RBC 3.79 (*)    Hemoglobin 10.7 (*)    HCT 35.9 (*)    MCHC 29.8 (*)    RDW 16.6 (*)    All other components within normal limits   BASIC METABOLIC PANEL - Abnormal; Notable for the following:    BUN 34 (*)    Creatinine, Ser 1.96 (*)    GFR calc non Af Amer 30 (*)    GFR calc Af Amer 35 (*)    All other components within normal limits  TROPONIN I   Imaging Review Dg Chest Portable 1 View  01/03/2014   CLINICAL DATA:  Weakness  EXAM: PORTABLE CHEST - 1 VIEW  COMPARISON:  09/04/2013  FINDINGS: Cardiomegaly. Vascular congestion. Low volumes. Bibasilar atelectasis. No pneumothorax.  IMPRESSION: Stable cardiomegaly and vascular congestion  Increasing bibasilar atelectasis.   Electronically Signed   By: Maryclare Bean [name].D.   On: 01/03/2014 08:04     EKG Interpretation  Date/Time:  Saturday January 03 2014 07:30:15 EDT Ventricular Rate:  45 PR Interval:  164 QRS Duration: 128 QT Interval:  582 QTC Calculation: 504 R Axis:   112 Text Interpretation:  Sinus bradycardia Nonspecific intraventricular  conduction delay Probable lateral infarct, age indeterminate Anteroseptal  infarct, old No significant change since last tracing Confirmed by [facility]   MD, Jonny RuizJOHN ([id number]) on 01/03/2014 7:39:00 AM     MDM   Final diagnoses:  Bradycardia with 41 - 50 beats per minute    I doubt any other EMC precluding discharge at this time including, but not necessarily limited to the following:ACS.  I personally performed the services described in this documentation, which was scribed in my presence. The recorded information has been reviewed and is accurate.  [name] Tzipporah Nagorski, MD 01/04/14 [phone]

## 2014-01-03 NOTE — ED Notes (Signed)
Pt put on amiodarone a few weeks ago and states he has felt bad since then.  Heart rate has been 40-50's.  Also on a beta blocker.  [facility] [place].

## 2014-01-03 NOTE — ED Notes (Signed)
[facility] [place].

## 2014-01-03 NOTE — Discharge Instructions (Signed)
Stop taking pindolol until you see your cardiologist.  Bradycardia Bradycardia is a term for a heart rate (pulse) that, in adults, is slower than 60 beats per minute. A normal rate is 60 to 100 beats per minute. A heart rate below 60 beats per minute may be normal for some adults with healthy hearts. If the rate is too slow, the heart may have trouble pumping the volume of blood the body needs. If the heart rate gets too low, blood flow to the brain may be decreased and may make you feel lightheaded, dizzy, or faint. The heart has a natural pacemaker in the top of the heart called the SA node (sinoatrial or sinus node). This pacemaker sends out regular electrical signals to the muscle of the heart, telling the heart muscle when to beat (contract). The electrical signal travels from the upper parts of the heart (atria) through the AV node (atrioventricular node), to the lower chambers of the heart (ventricles). The ventricles squeeze, pumping the blood from your heart to your lungs and to the rest of your body. CAUSES   Problem with the heart's electrical system.  Problem with the heart's natural pacemaker.  Heart disease, damage, or infection.  Medications.  Problems with minerals and salts (electrolytes). SYMPTOMS   Fainting (syncope).  Fatigue and weakness.  Shortness of breath (dyspnea).  Chest pain (angina).  Drowsiness.  Confusion. DIAGNOSIS   An electrocardiogram (ECG) can help your caregiver determine the type of slow heart rate you have.  If the cause is not seen on an ECG, you may need to wear a heart monitor that records your heart rhythm for several hours or days.  Blood tests. TREATMENT   Electrolyte supplements.  Medications.  Withholding medication which is causing a slow heart rate.  Pacemaker placement. SEEK IMMEDIATE MEDICAL CARE IF:   You feel lightheaded or faint.  You develop an irregular heart rate.  You feel chest pain or have trouble  breathing. MAKE SURE YOU:   Understand these instructions.  Will watch your condition.  Will get help right away if you are not doing well or get worse. Document Released: 11/12/2001 Document Revised: 05/15/2011 Document Reviewed: 05/28/2013 [facility] Patient Information 2015 [employer] [place]. This information is not intended to replace advice given to you by your health care provider. Make sure you discuss any questions you have with your health care provider.

## 2014-01-28 ENCOUNTER — Encounter: Payer: Self-pay | Admitting: Cardiology

## 2014-01-28 ENCOUNTER — Ambulatory Visit (INDEPENDENT_AMBULATORY_CARE_PROVIDER_SITE_OTHER): Payer: Medicare Other | Admitting: Cardiology

## 2014-01-28 VITALS — BP 128/82 | HR 115 | Ht 73.0 in | Wt 255.0 lb

## 2014-01-28 DIAGNOSIS — I255 Ischemic cardiomyopathy: Secondary | ICD-10-CM

## 2014-01-28 DIAGNOSIS — I38 Endocarditis, valve unspecified: Secondary | ICD-10-CM

## 2014-01-28 DIAGNOSIS — I480 Paroxysmal atrial fibrillation: Secondary | ICD-10-CM

## 2014-01-28 MED ORDER — AMIODARONE HCL 200 MG PO TABS: 400.0000 mg | ORAL_TABLET | Freq: Two times a day (BID) | ORAL | Status: AC

## 2014-01-28 NOTE — Assessment & Plan Note (Signed)
Also atrial tachycardia. This is well documented, very difficult to control rates due to component of sick sinus syndrome as well. He was not able to tolerate even low-dose pindolol which I tried last. Plan to reload with amiodarone at 400 mg twice daily for a week, then return to 200 mg twice daily. He is not being anticoagulated as noted previously.

## 2014-01-28 NOTE — Progress Notes (Signed)
Reason for visit: CAD, cardiomyopathy, aortic stenosis  Clinical Summary Mr. Karna DupesGrogan is a medically complex 80 y.o.male seen recently in October. At the last visit we added pindolol to amiodarone for better control of his atrial arrhythmias. Interval ER visit noted with symptomatic bradycardia, heart rate 40-60. ECG showed sinus bradycardia and junctional beats. Pindolol was discontinued. He comes in today for a follow-up visit, states that he feels better, although his heart rate is back between 100 and 110 as it was before. This is consistent with his recurring atrial arrhythmias. He is now on amiodarone solely.  Lab work in October showed hemoglobin 10.7, platelets 212, potassium 4.4, BUN 34, creatinine 1.9, troponin I negative.  He has history of ischemic cardiomyopathy that is being managed conservatively at his request without further invasive or device workup. It has been noted that he tends to be symptomatic with rapid atrial arrhythmias, which is why he is on amiodarone. He is not anticoagulated with history of recurrent bleeding. Followup echocardiogram in April showed moderate LVH with LVEF 35-40%, restrictive diastolic filling pattern, severe left atrial enlargement, thickened mitral valve with moderate regurgitation, probable low gradient severe calcific aortic stenosis with trivial regurgitation, moderate RV dilatation with reduced contraction, moderate tricuspid regurgitation with PASP 62 mmHg and elevated CVP, small posterior pericardial effusion.  No Known Allergies  Current Outpatient Prescriptions  Medication Sig Dispense Refill  . acetaminophen (TYLENOL) 325 MG tablet Take 650 mg by mouth every 4 (four) hours as needed.    Marland Kitchen. amiodarone (PACERONE) 200 MG tablet Take 2 tablets (400 mg total) by mouth 2 (two) times daily. Then resume 200 mg twice daily 60 tablet 1  . amoxicillin (AMOXIL) 500 MG capsule Take 500 mg by mouth 3 (three) times daily.    Marland Kitchen. aspirin 81 MG tablet Take 81  mg by mouth daily.    Marland Kitchen. atorvastatin (LIPITOR) 80 MG tablet Take 1 tablet (80 mg total) by mouth daily at 6 PM. 30 tablet 0  . cyanocobalamin (,VITAMIN B-12,) 1000 MCG/ML injection Inject 1,000 mcg into the skin every 30 (thirty) days.     . furosemide (LASIX) 20 MG tablet Take 20 mg by mouth. Take 60 mg twice daily    . gabapentin (NEURONTIN) 300 MG capsule Take 300 mg by mouth daily.    . insulin aspart (NOVOLOG) 100 UNIT/ML injection Inject 10 Units into the skin daily. Novolog 0-12 units at 6am and 4pm Sliding scale and Novolog 10 units Q 5:30 pm ([facility])    . insulin glargine (LANTUS) 100 UNIT/ML injection Inject 60 Units into the skin daily. Patient takes 15 units at bedtime and 60 units at 0800    . ipratropium-albuterol (DUONEB) 0.5-2.5 (3) MG/3ML SOLN Take 3 mLs by nebulization 2 (two) times daily.     . isosorbide mononitrate (IMDUR) 30 MG 24 hr tablet Take 1 tablet (30 mg total) by mouth daily. 30 tablet 0  . Multiple Vitamin (MULTIVITAMIN) tablet Take 1 tablet by mouth daily.    . nitroGLYCERIN (NITROSTAT) 0.4 MG SL tablet Place 0.4 mg under the tongue every 5 (five) minutes as needed for chest pain.    . polyethylene glycol (MIRALAX / GLYCOLAX) packet Take 17 g by mouth daily.    . psyllium (HYDROCIL/METAMUCIL) 95 % PACK Take 1 packet by mouth 2 (two) times daily.    . traMADol (ULTRAM) 50 MG tablet Take 50 mg by mouth 3 (three) times daily as needed.     No  current facility-administered medications for this visit.    Past Medical History  Diagnosis Date  . Essential hypertension, benign   . Type 2 diabetes mellitus   . Coronary atherosclerosis of native coronary artery     Presumed and managed conservatively  . History of GI diverticular bleed   . History of gastric ulcer   . History of skin cancer   . Permanent atrial fibrillation   . NSTEMI (non-ST elevated myocardial infarction)   . Ischemic cardiomyopathy     LVEF 30-35%  . Orthostatic  hypotension   . Acute kidney failure   . Anemia   . Aortic stenosis     Social History Mr. Karna DupesGrogan reports that he quit smoking about 26 years ago. His smoking use included Cigarettes. He started smoking about 66 years ago. He has a 117 pack-year smoking history. He has quit using smokeless tobacco. His smokeless tobacco use included Chew. Mr. Karna DupesGrogan reports that he does not drink alcohol.  Review of Systems Complete review of systems negative except as otherwise outlined in the clinical summary.  Physical Examination Filed Vitals:   01/28/14 1430  BP: 128/82  Pulse: 115   Filed Weights   01/28/14 1430  Weight: 255 lb (115.667 kg)    Chronically ill-appearing male in no acute distress. In wheelchair.  HEENT: Conjunctiva and lids normal, oropharynx clear with poor dentition.  Neck: Supple, no elevated JVP bilateral carotid bruits, no thyromegaly.  Lungs: Diminished but cear to auscultation, nonlabored breathing at rest.  Cardiac: Rapid regular rate and rhythm, no S3, 2/6 systolic murmur, no pericardial rub.  Abdomen: Soft, nontender, bowel sounds present.  Extremities: 1+ edema, distal pulses 1+.    Problem List and Plan   Atrial fibrillation Also atrial tachycardia. This is well documented, very difficult to control rates due to component of sick sinus syndrome as well. He was not able to tolerate even low-dose pindolol which I tried last. Plan to reload with amiodarone at 400 mg twice daily for a week, then return to 200 mg twice daily. He is not being anticoagulated as noted previously.  Valvular heart disease Severe, low gradient, calcific aortic stenosis. Patient declines further evaluation.  Cardiomyopathy, ischemic Being managed conservatively at patient request. LVEF 35-40% by most recent echocardiogram. Currently not on ACE inhibitor or ARB with history of renal insufficiency and hyperkalemia.    Jonelle SidleSamuel G. Deovion Batrez, M.D., F.A.C.C.

## 2014-01-28 NOTE — Assessment & Plan Note (Signed)
Severe, low gradient, calcific aortic stenosis. Patient declines further evaluation. 

## 2014-01-28 NOTE — Patient Instructions (Signed)
Your physician recommends that you schedule a follow-up appointment in: keep March 30, 2014 @10 :00 am with Dr. Diona BrownerMcDowell. Your physician has recommended you make the following change in your medication:  Increase amiodarone to 400 mg twice daily for 1 week; then, resume 200 mg twice daily. Continue all other medications the same.

## 2014-01-28 NOTE — Assessment & Plan Note (Signed)
Being managed conservatively at patient request. LVEF 35-40% by most recent echocardiogram. Currently not on ACE inhibitor or ARB with history of renal insufficiency and hyperkalemia. 

## 2014-01-31 ENCOUNTER — Emergency Department (HOSPITAL_COMMUNITY): Payer: Medicare Other

## 2014-01-31 ENCOUNTER — Inpatient Hospital Stay (HOSPITAL_COMMUNITY)
Admission: EM | Admit: 2014-01-31 | Discharge: 2014-03-06 | DRG: 292 | Disposition: E | Payer: Medicare Other | Attending: Internal Medicine | Admitting: Internal Medicine

## 2014-01-31 ENCOUNTER — Encounter (HOSPITAL_COMMUNITY): Payer: Self-pay | Admitting: Cardiology

## 2014-01-31 DIAGNOSIS — N183 Chronic kidney disease, stage 3 (moderate): Secondary | ICD-10-CM | POA: Diagnosis present

## 2014-01-31 DIAGNOSIS — I248 Other forms of acute ischemic heart disease: Secondary | ICD-10-CM | POA: Diagnosis present

## 2014-01-31 DIAGNOSIS — Z794 Long term (current) use of insulin: Secondary | ICD-10-CM | POA: Diagnosis not present

## 2014-01-31 DIAGNOSIS — N179 Acute kidney failure, unspecified: Secondary | ICD-10-CM | POA: Diagnosis present

## 2014-01-31 DIAGNOSIS — Z8249 Family history of ischemic heart disease and other diseases of the circulatory system: Secondary | ICD-10-CM | POA: Diagnosis not present

## 2014-01-31 DIAGNOSIS — Z66 Do not resuscitate: Secondary | ICD-10-CM | POA: Diagnosis present

## 2014-01-31 DIAGNOSIS — F17220 Nicotine dependence, chewing tobacco, uncomplicated: Secondary | ICD-10-CM | POA: Diagnosis present

## 2014-01-31 DIAGNOSIS — E875 Hyperkalemia: Secondary | ICD-10-CM | POA: Diagnosis not present

## 2014-01-31 DIAGNOSIS — I2489 Other forms of acute ischemic heart disease: Secondary | ICD-10-CM | POA: Diagnosis present

## 2014-01-31 DIAGNOSIS — K7460 Unspecified cirrhosis of liver: Secondary | ICD-10-CM | POA: Insufficient documentation

## 2014-01-31 DIAGNOSIS — I482 Chronic atrial fibrillation: Secondary | ICD-10-CM | POA: Diagnosis present

## 2014-01-31 DIAGNOSIS — Z79899 Other long term (current) drug therapy: Secondary | ICD-10-CM

## 2014-01-31 DIAGNOSIS — I272 Other secondary pulmonary hypertension: Secondary | ICD-10-CM | POA: Diagnosis present

## 2014-01-31 DIAGNOSIS — I5023 Acute on chronic systolic (congestive) heart failure: Secondary | ICD-10-CM | POA: Diagnosis present

## 2014-01-31 DIAGNOSIS — J90 Pleural effusion, not elsewhere classified: Secondary | ICD-10-CM | POA: Diagnosis present

## 2014-01-31 DIAGNOSIS — R0602 Shortness of breath: Secondary | ICD-10-CM

## 2014-01-31 DIAGNOSIS — N289 Disorder of kidney and ureter, unspecified: Secondary | ICD-10-CM

## 2014-01-31 DIAGNOSIS — E119 Type 2 diabetes mellitus without complications: Secondary | ICD-10-CM | POA: Diagnosis present

## 2014-01-31 DIAGNOSIS — N189 Chronic kidney disease, unspecified: Secondary | ICD-10-CM

## 2014-01-31 DIAGNOSIS — I214 Non-ST elevation (NSTEMI) myocardial infarction: Secondary | ICD-10-CM

## 2014-01-31 DIAGNOSIS — Z7982 Long term (current) use of aspirin: Secondary | ICD-10-CM

## 2014-01-31 DIAGNOSIS — I129 Hypertensive chronic kidney disease with stage 1 through stage 4 chronic kidney disease, or unspecified chronic kidney disease: Secondary | ICD-10-CM | POA: Diagnosis present

## 2014-01-31 DIAGNOSIS — I471 Supraventricular tachycardia: Secondary | ICD-10-CM | POA: Diagnosis present

## 2014-01-31 DIAGNOSIS — I350 Nonrheumatic aortic (valve) stenosis: Secondary | ICD-10-CM | POA: Diagnosis present

## 2014-01-31 DIAGNOSIS — I2510 Atherosclerotic heart disease of native coronary artery without angina pectoris: Secondary | ICD-10-CM | POA: Diagnosis present

## 2014-01-31 DIAGNOSIS — Z85828 Personal history of other malignant neoplasm of skin: Secondary | ICD-10-CM

## 2014-01-31 DIAGNOSIS — Z9889 Other specified postprocedural states: Secondary | ICD-10-CM

## 2014-01-31 DIAGNOSIS — I252 Old myocardial infarction: Secondary | ICD-10-CM

## 2014-01-31 DIAGNOSIS — I509 Heart failure, unspecified: Secondary | ICD-10-CM | POA: Insufficient documentation

## 2014-01-31 DIAGNOSIS — I5020 Unspecified systolic (congestive) heart failure: Secondary | ICD-10-CM | POA: Insufficient documentation

## 2014-01-31 DIAGNOSIS — R1319 Other dysphagia: Secondary | ICD-10-CM | POA: Diagnosis present

## 2014-01-31 DIAGNOSIS — R748 Abnormal levels of other serum enzymes: Secondary | ICD-10-CM

## 2014-01-31 DIAGNOSIS — I255 Ischemic cardiomyopathy: Secondary | ICD-10-CM | POA: Diagnosis present

## 2014-01-31 DIAGNOSIS — R1084 Generalized abdominal pain: Secondary | ICD-10-CM

## 2014-01-31 DIAGNOSIS — R140 Abdominal distension (gaseous): Secondary | ICD-10-CM

## 2014-01-31 HISTORY — DX: Do not resuscitate: Z66

## 2014-01-31 LAB — CBC WITH DIFFERENTIAL/PLATELET
BASOS PCT: 0 % (ref 0–1)
Basophils Absolute: 0 10*3/uL (ref 0.0–0.1)
EOS PCT: 0 % (ref 0–5)
Eosinophils Absolute: 0 10*3/uL (ref 0.0–0.7)
HEMATOCRIT: 38.2 % — AB (ref 39.0–52.0)
HEMOGLOBIN: 11.5 g/dL — AB (ref 13.0–17.0)
Lymphocytes Relative: 14 % (ref 12–46)
Lymphs Abs: 1.3 10*3/uL (ref 0.7–4.0)
MCH: 27.8 pg (ref 26.0–34.0)
MCHC: 30.1 g/dL (ref 30.0–36.0)
MCV: 92.5 fL (ref 78.0–100.0)
MONO ABS: 1 10*3/uL (ref 0.1–1.0)
MONOS PCT: 11 % (ref 3–12)
NEUTROS ABS: 7.1 10*3/uL (ref 1.7–7.7)
Neutrophils Relative %: 75 % (ref 43–77)
Platelets: 341 10*3/uL (ref 150–400)
RBC: 4.13 MIL/uL — ABNORMAL LOW (ref 4.22–5.81)
RDW: 16.8 % — AB (ref 11.5–15.5)
WBC: 9.4 10*3/uL (ref 4.0–10.5)

## 2014-01-31 LAB — BASIC METABOLIC PANEL
Anion gap: 14 (ref 5–15)
BUN: 34 mg/dL — AB (ref 6–23)
CO2: 28 mEq/L (ref 19–32)
CREATININE: 2.17 mg/dL — AB (ref 0.50–1.35)
Calcium: 8.9 mg/dL (ref 8.4–10.5)
Chloride: 97 mEq/L (ref 96–112)
GFR, EST AFRICAN AMERICAN: 31 mL/min — AB (ref >90)
GFR, EST NON AFRICAN AMERICAN: 27 mL/min — AB (ref >90)
GLUCOSE: 85 mg/dL (ref 70–99)
POTASSIUM: 5 meq/L (ref 3.7–5.3)
Sodium: 139 mEq/L (ref 137–147)

## 2014-01-31 LAB — MRSA PCR SCREENING: MRSA BY PCR: NEGATIVE

## 2014-01-31 LAB — TROPONIN I
TROPONIN I: 0.48 ng/mL — AB (ref <0.30)
TROPONIN I: 0.56 ng/mL — AB (ref <0.30)

## 2014-01-31 LAB — PRO B NATRIURETIC PEPTIDE: Pro B Natriuretic peptide (BNP): 10467 pg/mL — ABNORMAL HIGH (ref 0–450)

## 2014-01-31 LAB — GLUCOSE, CAPILLARY: Glucose-Capillary: 68 mg/dL — ABNORMAL LOW (ref 70–99)

## 2014-01-31 MED ORDER — POLYETHYLENE GLYCOL 3350 17 G PO PACK
17.0000 g | PACK | Freq: Every day | ORAL | Status: DC
  Administered 2014-01-31: 17 g via ORAL
  Filled 2014-01-31 (×2): qty 1

## 2014-01-31 MED ORDER — ACETAMINOPHEN 325 MG PO TABS: 650.0000 mg | ORAL_TABLET | ORAL | Status: DC | PRN

## 2014-01-31 MED ORDER — PSYLLIUM 95 % PO PACK
1.0000 | PACK | Freq: Two times a day (BID) | ORAL | Status: DC
  Administered 2014-02-01 – 2014-02-03 (×6): 1 via ORAL
  Filled 2014-01-31 (×8): qty 1

## 2014-01-31 MED ORDER — ALPRAZOLAM 0.25 MG PO TABS
0.2500 mg | ORAL_TABLET | Freq: Two times a day (BID) | ORAL | Status: DC | PRN
  Administered 2014-02-01: 0.25 mg via ORAL
  Filled 2014-01-31: qty 1

## 2014-01-31 MED ORDER — INSULIN GLARGINE 100 UNIT/ML ~~LOC~~ SOLN
60.0000 [IU] | Freq: Every day | SUBCUTANEOUS | Status: DC
  Administered 2014-02-01 – 2014-02-03 (×3): 60 [IU] via SUBCUTANEOUS
  Filled 2014-01-31 (×4): qty 0.6

## 2014-01-31 MED ORDER — ALBUTEROL (5 MG/ML) CONTINUOUS INHALATION SOLN
10.0000 mg/h | INHALATION_SOLUTION | Freq: Once | RESPIRATORY_TRACT | Status: AC
  Administered 2014-01-31: 10 mg/h via RESPIRATORY_TRACT
  Filled 2014-01-31: qty 20

## 2014-01-31 MED ORDER — INSULIN GLARGINE 100 UNIT/ML ~~LOC~~ SOLN
15.0000 [IU] | Freq: Every day | SUBCUTANEOUS | Status: DC
  Filled 2014-01-31 (×2): qty 0.15

## 2014-01-31 MED ORDER — TORSEMIDE 20 MG PO TABS: 40.0000 mg | ORAL_TABLET | Freq: Two times a day (BID) | ORAL | Status: DC

## 2014-01-31 MED ORDER — INSULIN ASPART 100 UNIT/ML ~~LOC~~ SOLN: 10.0000 [IU] | Freq: Every day | SUBCUTANEOUS | Status: DC

## 2014-01-31 MED ORDER — SODIUM CHLORIDE 0.9 % IJ SOLN
3.0000 mL | Freq: Two times a day (BID) | INTRAMUSCULAR | Status: DC
  Administered 2014-02-01 – 2014-02-03 (×5): 3 mL via INTRAVENOUS

## 2014-01-31 MED ORDER — SODIUM CHLORIDE 0.9 % IV SOLN: 250.0000 mL | INTRAVENOUS | Status: DC | PRN

## 2014-01-31 MED ORDER — ONDANSETRON HCL 4 MG/2ML IJ SOLN: 4.0000 mg | Freq: Four times a day (QID) | INTRAMUSCULAR | Status: DC | PRN

## 2014-01-31 MED ORDER — ISOSORBIDE MONONITRATE ER 60 MG PO TB24
30.0000 mg | ORAL_TABLET | Freq: Every day | ORAL | Status: DC
  Administered 2014-02-01 – 2014-02-03 (×3): 30 mg via ORAL
  Filled 2014-01-31 (×3): qty 1

## 2014-01-31 MED ORDER — NITROGLYCERIN 2 % TD OINT
1.0000 [in_us] | TOPICAL_OINTMENT | Freq: Once | TRANSDERMAL | Status: AC
  Administered 2014-01-31: 1 [in_us] via TOPICAL
  Filled 2014-01-31: qty 1

## 2014-01-31 MED ORDER — AMIODARONE HCL 200 MG PO TABS
400.0000 mg | ORAL_TABLET | Freq: Two times a day (BID) | ORAL | Status: DC
  Administered 2014-01-31 – 2014-02-01 (×3): 400 mg via ORAL
  Filled 2014-01-31 (×3): qty 2

## 2014-01-31 MED ORDER — ASPIRIN 81 MG PO CHEW
81.0000 mg | CHEWABLE_TABLET | Freq: Every day | ORAL | Status: DC
  Administered 2014-02-01 – 2014-02-03 (×3): 81 mg via ORAL
  Filled 2014-01-31 (×3): qty 1

## 2014-01-31 MED ORDER — SODIUM CHLORIDE 0.9 % IV SOLN
INTRAVENOUS | Status: DC
  Administered 2014-01-31: 17:00:00 via INTRAVENOUS

## 2014-01-31 MED ORDER — INSULIN ASPART 100 UNIT/ML ~~LOC~~ SOLN: 0.0000 [IU] | Freq: Three times a day (TID) | SUBCUTANEOUS | Status: DC

## 2014-01-31 MED ORDER — GABAPENTIN 300 MG PO CAPS
300.0000 mg | ORAL_CAPSULE | Freq: Two times a day (BID) | ORAL | Status: DC
  Administered 2014-01-31 – 2014-02-03 (×7): 300 mg via ORAL
  Filled 2014-01-31 (×7): qty 1

## 2014-01-31 MED ORDER — NITROGLYCERIN 0.4 MG SL SUBL
0.4000 mg | SUBLINGUAL_TABLET | SUBLINGUAL | Status: DC | PRN
Start: 2014-01-31 — End: 2014-02-04

## 2014-01-31 MED ORDER — IPRATROPIUM BROMIDE 0.02 % IN SOLN
1.0000 mg | Freq: Once | RESPIRATORY_TRACT | Status: AC
  Administered 2014-01-31: 1 mg via RESPIRATORY_TRACT
  Filled 2014-01-31: qty 5

## 2014-01-31 MED ORDER — ENOXAPARIN SODIUM 30 MG/0.3ML ~~LOC~~ SOLN
30.0000 mg | SUBCUTANEOUS | Status: DC
  Administered 2014-01-31: 30 mg via SUBCUTANEOUS
  Filled 2014-01-31: qty 0.3

## 2014-01-31 MED ORDER — POTASSIUM CHLORIDE 20 MEQ/15ML (10%) PO SOLN
20.0000 meq | Freq: Two times a day (BID) | ORAL | Status: DC
  Administered 2014-01-31: 20 meq via ORAL
  Filled 2014-01-31 (×2): qty 30

## 2014-01-31 MED ORDER — SODIUM CHLORIDE 0.9 % IJ SOLN: 3.0000 mL | INTRAMUSCULAR | Status: DC | PRN

## 2014-01-31 MED ORDER — TRAMADOL HCL 50 MG PO TABS
50.0000 mg | ORAL_TABLET | Freq: Three times a day (TID) | ORAL | Status: DC | PRN
Start: 2014-01-31 — End: 2014-02-04
  Administered 2014-02-01: 50 mg via ORAL
  Filled 2014-01-31: qty 1

## 2014-01-31 MED ORDER — AMOXICILLIN 250 MG PO CAPS
500.0000 mg | ORAL_CAPSULE | Freq: Three times a day (TID) | ORAL | Status: DC
  Administered 2014-01-31 – 2014-02-03 (×9): 500 mg via ORAL
  Filled 2014-01-31 (×9): qty 2

## 2014-01-31 MED ORDER — BUMETANIDE 0.25 MG/ML IJ SOLN
1.0000 mg | Freq: Once | INTRAMUSCULAR | Status: AC
  Administered 2014-01-31: 1 mg via INTRAVENOUS
  Filled 2014-01-31 (×2): qty 4

## 2014-01-31 MED ORDER — ASPIRIN 81 MG PO CHEW
324.0000 mg | CHEWABLE_TABLET | Freq: Once | ORAL | Status: AC
  Administered 2014-01-31: 324 mg via ORAL
  Filled 2014-01-31: qty 4

## 2014-01-31 MED ORDER — ATORVASTATIN CALCIUM 40 MG PO TABS
80.0000 mg | ORAL_TABLET | Freq: Every day | ORAL | Status: DC
  Administered 2014-01-31 – 2014-02-02 (×3): 80 mg via ORAL
  Filled 2014-01-31 (×3): qty 2

## 2014-01-31 NOTE — ED Notes (Signed)
Skin tear to right elbow that pt had prior to arrival to the ER.  Placed bandaid on skin tear.

## 2014-01-31 NOTE — Progress Notes (Signed)
Patient refused to continue use of BiPAP ( he wore it for less than five minutes) stating he "felt as if he was being smothered and that he did not need it." BiPAP mask was removed and patient returned to 2L cannula.

## 2014-01-31 NOTE — ED Notes (Signed)
Removed ntg paste.  B/p 99/77.  RT unable to get pt to wear bi-pap.  hospitalist notified.  Orders received.

## 2014-01-31 NOTE — Progress Notes (Signed)
Notified Dr. Adrian BlackwaterStinson of critical troponin of 0.56. Dr. Is aware and is going to continue to monitor them. States he believes it is elevated due to CHF status. Pt c/o no pain and is a symptomatic. Will continue to monitor and notify physician of any changes.

## 2014-01-31 NOTE — H&P (Signed)
History and Physical  Richard BooneSamuel L [name] XBJ:[id number] DOB: 03-13-1930 DOA: 01/29/2014  Referring physician: Dr Clarene DukeMcManus, ED physician PCP: [name],ASHISH, MD   Chief Complaint: 25 pound weight gain  HPI: [name] is a 80 y.o. male  With history of diabetes, hypertension, systolic heart failure with a LVEF of 30-35% on echocardiogram obtained April 2015. Patient also has stage III chronic kidney disease, coronary artery disease, aortic stenosis. [facility].  He presents to the hospital due to swelling in the legs with worsening dyspnea on exertion with a 25 pound weight gain since late October. The provider at the resident home has been increasing his furosemide and had written to change him to torsemide tomorrow, however the patient continued to be short of breath and was sent to the hospital. Here, the patient is less concerned about his breathing and is focused on the significant edema in his legs bilaterally. He does have a cough with some mild white sputum production. He denies chest pain, palpitations, dizziness, lightheadedness, abdominal pain   Review of Systems:   Pt complains of peripheral edema, weight gain, shortness of breath, orthopnea, decreased appetite  Pt denies any wheezing, cough, fever, chills, abdominal pain, nausea, vomiting, chest pain, palpitations, pleurisy, claudication.  Review of systems are otherwise negative  Past Medical History  Diagnosis Date  . Essential hypertension, benign   . Type 2 diabetes mellitus   . Coronary atherosclerosis of native coronary artery     Presumed and managed conservatively  . History of GI diverticular bleed   . History of gastric ulcer   . History of skin cancer   . Permanent atrial fibrillation   . NSTEMI (non-ST elevated myocardial infarction)   . Ischemic cardiomyopathy     LVEF 30-35%  . Orthostatic hypotension   . Acute kidney failure   . Anemia   . Aortic stenosis   . DNR (do not  resuscitate)    Past Surgical History  Procedure Laterality Date  . Abdominal surgery     Social History:  reports that he quit smoking about 26 years ago. His smoking use included Cigarettes. He started smoking about 66 years ago. He has a 117 pack-year smoking history. He has quit using smokeless tobacco. His smokeless tobacco use included Chew. He reports that he does not drink alcohol or use illicit drugs. [facility]  No Known Allergies  Family History  Problem Relation Age of Onset  . Hypertension Father       Prior to Admission medications   Medication Sig Start Date End Date Taking? Authorizing Provider  acetaminophen (TYLENOL) 325 MG tablet Take 650 mg by mouth every 4 (four) hours as needed.   Yes Historical Provider, MD  amiodarone (PACERONE) 200 MG tablet Take 2 tablets (400 mg total) by mouth 2 (two) times daily. Then resume 200 mg twice daily 01/28/14  Yes Jonelle SidleSamuel G McDowell, MD  amoxicillin (AMOXIL) 500 MG capsule Take 500 mg by mouth 3 (three) times daily.   Yes Historical Provider, MD  aspirin 81 MG tablet Take 81 mg by mouth daily.   Yes Historical Provider, MD  atorvastatin (LIPITOR) 80 MG tablet Take 1 tablet (80 mg total) by mouth daily at 6 PM. 06/20/13  Yes Catarina Hartshornavid Tat, MD  furosemide (LASIX) 20 MG tablet Take 20 mg by mouth. Take 60 mg twice daily   Yes Historical Provider, MD  gabapentin (NEURONTIN) 300 MG capsule Take 300 mg by mouth 2 (  two) times daily.    Yes Historical Provider, MD  insulin aspart (NOVOLOG) 100 UNIT/ML injection Inject 10 Units into the skin daily.    Yes Historical Provider, MD  insulin glargine (LANTUS) 100 UNIT/ML injection Inject 60 Units into the skin daily.    Yes Historical Provider, MD  insulin glargine (LANTUS) 100 UNIT/ML injection Inject 15 Units into the skin at bedtime.   Yes Historical Provider, MD  ipratropium-albuterol (DUONEB) 0.5-2.5 (3) MG/3ML SOLN Take 3 mLs by nebulization 2 (two) times daily.    Yes  Historical Provider, MD  isosorbide mononitrate (IMDUR) 30 MG 24 hr tablet Take 1 tablet (30 mg total) by mouth daily. 06/20/13  Yes Catarina Hartshorn, MD  loratadine (CLARITIN) 10 MG tablet Take 10 mg by mouth daily.   Yes Historical Provider, MD  Multiple Vitamin (MULTIVITAMIN) tablet Take 1 tablet by mouth daily.   Yes Historical Provider, MD  polyethylene glycol (MIRALAX / GLYCOLAX) packet Take 17 g by mouth daily.   Yes Historical Provider, MD  psyllium (HYDROCIL/METAMUCIL) 95 % PACK Take 1 packet by mouth 2 (two) times daily.   Yes Historical Provider, MD  torsemide (DEMADEX) 20 MG tablet Take 60 mg by mouth daily.   Yes Historical Provider, MD  traMADol (ULTRAM) 50 MG tablet Take 50 mg by mouth 3 (three) times daily as needed.   Yes Historical Provider, MD  cyanocobalamin (,VITAMIN B-12,) 1000 MCG/ML injection Inject 1,000 mcg into the skin every 30 (thirty) days.  05/06/12   Historical Provider, MD  nitroGLYCERIN (NITROSTAT) 0.4 MG SL tablet Place 0.4 mg under the tongue every 5 (five) minutes as needed for chest pain.    Historical Provider, MD    Physical Exam: BP 109/57 mmHg  Pulse 65  Temp(Src) 97.6 F (36.4 C) (Oral)  Resp 21  Ht 6\' 1"  (1.854 m)  Wt 123.832 kg (273 lb)  BMI 36.03 kg/m2  SpO2 100%  General: Elderly Caucasian male. Awake and alert and oriented x3. No acute cardiopulmonary distress.  Eyes: Pupils equal, round, reactive to light. Extraocular muscles are intact. Sclerae anicteric and noninjected.  ENT:  Moist mucosal membranes. No mucosal lesions.  Neck: Neck supple without lymphadenopathy. No carotid bruits. No masses palpated.  Cardiovascular: Regular rate with normal S1-S2 sounds. No murmurs, rubs, gallops auscultated. Increased JVD.  Respiratory: Good respiratory effort with no wheezes, rales, rhonchi. Lungs clear to auscultation bilaterally.  Abdomen: Soft, nontender, nondistended. Active bowel sounds. No masses or hepatosplenomegaly  Skin: Dry, warm to touch. 2+  dorsalis pedis and radial pulses. 4+ pitting edema from feet to knees. Patient does have some anasarca Musculoskeletal: No calf or leg pain. All major joints not erythematous nontender.  Psychiatric: Intact judgment and insight.  Neurologic: No focal neurological deficits. Cranial nerves II through XII are grossly intact.           Labs on Admission:  Basic Metabolic Panel:  Recent Labs Lab 01/07/2014 1551  NA 139  K 5.0  CL 97  CO2 28  GLUCOSE 85  BUN 34*  CREATININE 2.17*  CALCIUM 8.9   Liver Function Tests: No results for input(s): AST, ALT, ALKPHOS, BILITOT, PROT, ALBUMIN in the last 168 hours. No results for input(s): LIPASE, AMYLASE in the last 168 hours. No results for input(s): AMMONIA in the last 168 hours. CBC:  Recent Labs Lab 01/15/2014 1551  WBC 9.4  NEUTROABS 7.1  HGB 11.5*  HCT 38.2*  MCV 92.5  PLT 341   Cardiac Enzymes:  Recent Labs  Lab 01/23/2014 1551  TROPONINI 0.48*    BNP (last 3 results)  Recent Labs  01/22/2014 1551  PROBNP 10467.0*   CBG: No results for input(s): GLUCAP in the last 168 hours.  Radiological Exams on Admission: Dg Chest Port 1 View  01/30/2014   CLINICAL DATA:  Shortness of breath and chest congestion. Swelling of the lower extremities today.  EXAM: PORTABLE CHEST - 1 VIEW  COMPARISON:  Chest x-rays dated 01/03/2014 and 09/04/2013  FINDINGS: There is pulmonary vascular congestion with bilateral pleural effusions and slight pulmonary edema. Calcification is present in the arch of the aorta. No acute osseous abnormality.  IMPRESSION: Increased pulmonary vascular congestion with new small effusions and slight pulmonary edema at the bases. Findings are consistent with congestive heart failure.   Electronically Signed   By: Geanie CooleyJim  Maxwell M.D.   On: 02/02/2014 16:52    EKG: Independently reviewed. Junctional rhythm.  Mild nonspecific ST depression in lateral leads.  Assessment/Plan Present on Admission:  . Demand ischemia .  Acute on chronic renal failure  #1 congestive heart failure Patient's really fluid overloaded. Will give patient Bumex 1 mg in the emergency department for diuresis. Additionally, we'll admit the patient with telemetry for close monitoring. Will continue diuresis with torsemide 40 mg by mouth twice a day. Additionally, will fluid restrict and monitor strict I's and O's with daily weights. Will obtain cardiac echo  #2 demand ischemia We'll obtain serial troponins  #3 acute on chronic renal failure We'll be cautious with diuretics, however patient is in need of diuresis and creatinine may worsen throughout the diuresis  #4 diabetes Continue home insulin dose with sliding scale  #5 ischemic cardiomyopathy Continue amiodarone, Imdur   DVT prophylaxis: Lovenox  Consultants: None  Code Status: DO NOT RESUSCITATE  Family Communication: None   Disposition Plan: [facility] after diuresis  Time spent: 70 minutes  Candelaria CelesteJacob Makala Fetterolf, DO [facility] Pager [phone]

## 2014-01-31 NOTE — ED Provider Notes (Signed)
CSN: [id number]     Arrival date & time 01/23/2014  1543 History   First MD Initiated Contact with Patient 01/23/2014 1550     Chief Complaint  Patient presents with  . Shortness of Breath     HPI Pt was seen at 1600. Per EMS, NH report, pt and his caregiver, pt c/o gradual onset and worsening of persistent SOB and weight gain for the past several weeks, worse over the past several days. Pt states he has "gained 25lbs since Halloween." Caregiver states pt has been receiving lasix 40mg  BID until 01/14/14, then increased to 60mg  BID, with LD this morning. Pt states his "legs hurt from all the swelling" and "the medicines aren't working." Denies CP/palpitations, no cough, no abd pain, no N/V/D, no fevers.    Past Medical History  Diagnosis Date  . Essential hypertension, benign   . Type 2 diabetes mellitus   . Coronary atherosclerosis of native coronary artery     Presumed and managed conservatively  . History of GI diverticular bleed   . History of gastric ulcer   . History of skin cancer   . Permanent atrial fibrillation   . NSTEMI (non-ST elevated myocardial infarction)   . Ischemic cardiomyopathy     LVEF 30-35%  . Orthostatic hypotension   . Acute kidney failure   . Anemia   . Aortic stenosis   . DNR (do not resuscitate)    Past Surgical History  Procedure Laterality Date  . Abdominal surgery     Family History  Problem Relation Age of Onset  . Hypertension Father    History  Substance Use Topics  . Smoking status: Former Smoker -- 3.00 packs/day for 39 years    Types: Cigarettes    Start date: 10/31/1947    Quit date: 03/07/1987  . Smokeless tobacco: Former Neurosurgeon    Types: Chew  . Alcohol Use: No    Review of Systems ROS: Statement: All systems negative except as marked or noted in the HPI; Constitutional: Negative for fever and chills. ; ; Eyes: Negative for eye pain, redness and discharge. ; ; ENMT: Negative for ear pain, hoarseness, nasal congestion, sinus  pressure and sore throat. ; ; Cardiovascular: Negative for chest pain, palpitations, diaphoresis, +dyspnea and peripheral edema. ; ; Respiratory: Negative for cough, wheezing and stridor. ; ; Gastrointestinal: Negative for nausea, vomiting, diarrhea, abdominal pain, blood in stool, hematemesis, jaundice and rectal bleeding. . ; ; Genitourinary: Negative for dysuria, flank pain and hematuria. ; ; Musculoskeletal: Negative for back pain and neck pain. Negative for swelling and trauma.; ; Skin: Negative for pruritus, rash, abrasions, blisters, bruising and skin lesion.; ; Neuro: Negative for headache, lightheadedness and neck stiffness. Negative for weakness, altered level of consciousness , altered mental status, extremity weakness, paresthesias, involuntary movement, seizure and syncope.      Allergies  Review of patient's allergies indicates no known allergies.  Home Medications   Prior to Admission medications   Medication Sig Start Date End Date Taking? Authorizing Provider  acetaminophen (TYLENOL) 325 MG tablet Take 650 mg by mouth every 4 (four) hours as needed.   Yes Historical Provider, MD  amiodarone (PACERONE) 200 MG tablet Take 2 tablets (400 mg total) by mouth 2 (two) times daily. Then resume 200 mg twice daily 01/28/14  Yes Jonelle Sidle, MD  amoxicillin (AMOXIL) 500 MG capsule Take 500 mg by mouth 3 (three) times daily.   Yes Historical Provider, MD  aspirin 81 MG tablet Take 81  mg by mouth daily.   Yes Historical Provider, MD  atorvastatin (LIPITOR) 80 MG tablet Take 1 tablet (80 mg total) by mouth daily at 6 PM. 06/20/13  Yes Catarina Hartshorn, MD  furosemide (LASIX) 20 MG tablet Take 20 mg by mouth. Take 60 mg twice daily   Yes Historical Provider, MD  gabapentin (NEURONTIN) 300 MG capsule Take 300 mg by mouth 2 (two) times daily.    Yes Historical Provider, MD  insulin aspart (NOVOLOG) 100 UNIT/ML injection Inject 10 Units into the skin daily.    Yes Historical Provider, MD  insulin  glargine (LANTUS) 100 UNIT/ML injection Inject 60 Units into the skin daily.    Yes Historical Provider, MD  insulin glargine (LANTUS) 100 UNIT/ML injection Inject 15 Units into the skin at bedtime.   Yes Historical Provider, MD  ipratropium-albuterol (DUONEB) 0.5-2.5 (3) MG/3ML SOLN Take 3 mLs by nebulization 2 (two) times daily.    Yes Historical Provider, MD  isosorbide mononitrate (IMDUR) 30 MG 24 hr tablet Take 1 tablet (30 mg total) by mouth daily. 06/20/13  Yes Catarina Hartshorn, MD  loratadine (CLARITIN) 10 MG tablet Take 10 mg by mouth daily.   Yes Historical Provider, MD  Multiple Vitamin (MULTIVITAMIN) tablet Take 1 tablet by mouth daily.   Yes Historical Provider, MD  polyethylene glycol (MIRALAX / GLYCOLAX) packet Take 17 g by mouth daily.   Yes Historical Provider, MD  psyllium (HYDROCIL/METAMUCIL) 95 % PACK Take 1 packet by mouth 2 (two) times daily.   Yes Historical Provider, MD  torsemide (DEMADEX) 20 MG tablet Take 60 mg by mouth daily.   Yes Historical Provider, MD  traMADol (ULTRAM) 50 MG tablet Take 50 mg by mouth 3 (three) times daily as needed.   Yes Historical Provider, MD  cyanocobalamin (,VITAMIN B-12,) 1000 MCG/ML injection Inject 1,000 mcg into the skin every 30 (thirty) days.  05/06/12   Historical Provider, MD  nitroGLYCERIN (NITROSTAT) 0.4 MG SL tablet Place 0.4 mg under the tongue every 5 (five) minutes as needed for chest pain.    Historical Provider, MD   BP 99/77 mmHg  Pulse 67  Temp(Src) 97.6 F (36.4 C) (Oral)  Resp 28  Ht 6\' 1"  (1.854 m)  Wt 273 lb (123.832 kg)  BMI 36.03 kg/m2  SpO2 100%  Filed Vitals:   02-06-14 1727 02-06-14 1745 2014-02-06 1800 02/06/2014 1805  BP: 95/59 128/106 99/77   Pulse: 67 67  67  Temp:      TempSrc:      Resp: 19 25 29 28   Height:      Weight:      SpO2: 100% 100%  100%    Physical Exam  1605: Physical examination:  Nursing notes reviewed; Vital signs and O2 SAT reviewed;  Constitutional: Well developed, Well nourished, In no  acute distress; Head:  Normocephalic, atraumatic; Eyes: EOMI, PERRL, No scleral icterus; ENMT: Mouth and pharynx normal, Mucous membranes dry; Neck: Supple, Full range of motion, No lymphadenopathy; Cardiovascular: Regular rate and rhythm, No gallop; Respiratory: Breath sounds coarse & equal bilaterally, insp/exp wheezes bilat with occasional audible wheezing. Speaking sentences, mild tachypnea. Normal respiratory effort/excursion; Chest: Nontender, Movement normal; Abdomen: Soft, Nontender, Nondistended, Normal bowel sounds; Genitourinary: No CVA tenderness; Extremities: Pulses normal, No deformity. +3 pedal edema feet to knees bilat with chronic stasis changes. No calf asymmetry.; Neuro: AA&Ox3, +HOH, otherwise major CN grossly intact.  Speech clear. No gross focal motor or sensory deficits in extremities.; Skin: Color normal, Warm, Dry.  ED Course  Procedures     EKG Interpretation   Date/Time:  Saturday January 31 2014 15:57:47 EST on arrival Ventricular Rate:  66 PR Interval:    QRS Duration: 144 QT Interval:  399 QTC Calculation: 418 R Axis:   107 Text Interpretation:  Junctional rhythm IVCD, consider atypical RBBB  Anteroseptal infarct, old Nonspecific ST depression Nonspecific ST and T  wave abnormality Artifact Baseline wander When compared with ECG of  01/03/2014 Nonspecific ST and T wave abnormality is now Present Confirmed  by [facility]  MD, Ronit Marczak [phone]([id number]) on 2014/01/24 6:12:45 PM      EKG Interpretation  Date/Time:  Saturday January 31 2014 17:36:33 EST repeat Ventricular Rate:  68 PR Interval:  489 QRS Duration: 148 QT Interval:  392 QTC Calculation: 417 R Axis:   102 Text Interpretation:  Junctional rhythm IVCD, consider atypical RBBB Anteroseptal infarct, old Nonspecific ST and T wave abnormality Nonspecific ST depression Since last tracing of earlier today No significant change was found Confirmed by [facility]  MD, Clinton Wahlberg [phone]([id number]) on 2014/01/24 6:16:21 PM         EKG Interpretation  Date/Time:  Saturday January 31 2014 17:42:45 EST right sided EKG Ventricular Rate:  68 PR Interval:  489 QRS Duration: 149 QT Interval:  399 QTC Calculation: 424 R Axis:   103 Text Interpretation:  Right and left arm electrode reversal, interpretation assumes no reversal Junctional rhythm IVCD, consider atypical RBBB Anteroseptal infarct, old Nonspecific ST and T wave abnormality Baseline wander Confirmed by [facility]  MD, Nicholos JohnsKATHLEEN [phone]([id number]) on 2014/01/24 6:18:50 PM         MDM  MDM Reviewed: previous chart, nursing note and vitals Reviewed previous: labs and ECG Interpretation: labs, ECG and x-ray Total time providing critical care: 30-74 minutes. This excludes time spent performing separately reportable procedures and services. Consults: admitting MD   CRITICAL CARE Performed by: Laray AngerMCMANUS,Jalila Goodnough M Total critical care time: 35 Critical care time was exclusive of separately billable procedures and treating other patients. Critical care was necessary to treat or prevent imminent or life-threatening deterioration. Critical care was time spent personally by me on the following activities: development of treatment plan with patient and/or surrogate as well as nursing, discussions with consultants, evaluation of patient's response to treatment, examination of patient, obtaining history from patient or surrogate, ordering and performing treatments and interventions, ordering and review of laboratory studies, ordering and review of radiographic studies, pulse oximetry and re-evaluation of patient's condition.    Results for orders placed or performed during the hospital encounter of June 29, 2013  Basic metabolic panel  Result Value Ref Range   Sodium 139 137 - 147 mEq/L   Potassium 5.0 3.7 - 5.3 mEq/L   Chloride 97 96 - 112 mEq/L   CO2 28 19 - 32 mEq/L   Glucose, Bld 85 70 - 99 mg/dL   BUN 34 (H) 6 - 23 mg/dL   Creatinine, Ser 8.292.17 (H) 0.50 - 1.35 mg/dL    Calcium 8.9 8.4 - 56.210.5 mg/dL   GFR calc non Af Amer 27 (L) >90 mL/min   GFR calc Af Amer 31 (L) >90 mL/min   Anion gap 14 5 - 15  Pro b natriuretic peptide (BNP)  Result Value Ref Range   Pro B Natriuretic peptide (BNP) 10467.0 (H) 0 - 450 pg/mL  Troponin I  Result Value Ref Range   Troponin I 0.48 (HH) <0.30 ng/mL  CBC with Differential  Result Value Ref Range   WBC 9.4 4.0 - 10.5  K/uL   RBC 4.13 (L) 4.22 - 5.81 MIL/uL   Hemoglobin 11.5 (L) 13.0 - 17.0 g/dL   HCT 16.138.2 (L) 09.639.0 - 04.552.0 %   MCV 92.5 78.0 - 100.0 fL   MCH 27.8 26.0 - 34.0 pg   MCHC 30.1 30.0 - 36.0 g/dL   RDW 40.916.8 (H) 81.111.5 - 91.415.5 %   Platelets 341 150 - 400 K/uL   Neutrophils Relative % 75 43 - 77 %   Neutro Abs 7.1 1.7 - 7.7 K/uL   Lymphocytes Relative 14 12 - 46 %   Lymphs Abs 1.3 0.7 - 4.0 K/uL   Monocytes Relative 11 3 - 12 %   Monocytes Absolute 1.0 0.1 - 1.0 K/uL   Eosinophils Relative 0 0 - 5 %   Eosinophils Absolute 0.0 0.0 - 0.7 K/uL   Basophils Relative 0 0 - 1 %   Basophils Absolute 0.0 0.0 - 0.1 K/uL   Dg Chest Port 1 View 10/22/13   CLINICAL DATA:  Shortness of breath and chest congestion. Swelling of the lower extremities today.  EXAM: PORTABLE CHEST - 1 VIEW  COMPARISON:  Chest x-rays dated 01/03/2014 and 09/04/2013  FINDINGS: There is pulmonary vascular congestion with bilateral pleural effusions and slight pulmonary edema. Calcification is present in the arch of the aorta. No acute osseous abnormality.  IMPRESSION: Increased pulmonary vascular congestion with new small effusions and slight pulmonary edema at the bases. Findings are consistent with congestive heart failure.   Electronically Signed   By: Geanie CooleyJim  Maxwell M.D.   On: 10/22/13 16:52    Results for [name], Arvine L (MRN [id number]) as of 10/22/13 18:19  Ref. Range 06/19/2013 05:19 06/20/2013 04:28 08/24/2013 07:43 01/03/2014 07:36 10/22/13 15:51  BUN Latest Range: 6-23 mg/dL 28 (H) 29 (H) 28 (H) 34 (H) 34 (H)  Creatinine Latest Range:  0.50-1.35 mg/dL 0.861.49 (H) 5.781.60 (H) 4.691.55 (H) 1.96 (H) 2.17 (H)    1700:   H/H per baseline. BNP elevated, no old to compare, with CHF on CXR: pt has already received lasix 60mg  today; will dose ntg paste and start bipap. BUN/Cr elevated from baseline. Troponin also elevated today, but no acute ST elevation on EKG, and pt denies CP; elevation likely due to demand: will dose ASA. T/C to [facility]. Adrian BlackwaterStinson, case discussed, including:  HPI, pertinent PM/SHx, VS/PE, dx testing, ED course and treatment:  Agreeable to admit, requests he will come to ED for evaluation.   Pierce JesterKathleen Khaliq Turay, DO 02/02/14 [phone]

## 2014-01-31 NOTE — Progress Notes (Signed)
Pt informed me that he had not urinated since about 10am this morning. He said he did not feel like he had to go so I tried to bladder scan him. Bladder scanner wouldn't read so I went ahead and did the In and Out catheter on him and got back 130cc. I let Dr. Conley RollsLe know since Dr. Adrian BlackwaterStinson is off duty and he said we will continue to monitor him and if he has not voided in 6 hours to place a foley catheter.

## 2014-01-31 NOTE — ED Notes (Addendum)
Sob getting worse last few days.  Pt states he has gained some weight too.  Approximately 25 lbs since Halloween.   Denies any pain.   4 plus pitting edema to legs and abdomen.  Sat 99 on 2 liters [place] with EMS.

## 2014-02-01 ENCOUNTER — Inpatient Hospital Stay (HOSPITAL_COMMUNITY): Payer: Medicare Other

## 2014-02-01 DIAGNOSIS — I059 Rheumatic mitral valve disease, unspecified: Secondary | ICD-10-CM

## 2014-02-01 DIAGNOSIS — I5021 Acute systolic (congestive) heart failure: Secondary | ICD-10-CM

## 2014-02-01 LAB — BASIC METABOLIC PANEL
Anion gap: 15 (ref 5–15)
BUN: 42 mg/dL — ABNORMAL HIGH (ref 6–23)
CALCIUM: 8.8 mg/dL (ref 8.4–10.5)
CO2: 25 mEq/L (ref 19–32)
Chloride: 99 mEq/L (ref 96–112)
Creatinine, Ser: 2.49 mg/dL — ABNORMAL HIGH (ref 0.50–1.35)
GFR calc Af Amer: 26 mL/min — ABNORMAL LOW (ref >90)
GFR, EST NON AFRICAN AMERICAN: 23 mL/min — AB (ref >90)
Glucose, Bld: 85 mg/dL (ref 70–99)
Potassium: 5.7 mEq/L — ABNORMAL HIGH (ref 3.7–5.3)
Sodium: 139 mEq/L (ref 137–147)

## 2014-02-01 LAB — BLOOD GAS, ARTERIAL
Acid-Base Excess: 1.1 mmol/L (ref 0.0–2.0)
BICARBONATE: 25.7 meq/L — AB (ref 20.0–24.0)
DRAWN BY: 25788
O2 CONTENT: 2 L/min
O2 SAT: 95.8 %
PH ART: 7.379 (ref 7.350–7.450)
Patient temperature: 37
TCO2: 23.5 mmol/L (ref 0–100)
pCO2 arterial: 44.6 mmHg (ref 35.0–45.0)
pO2, Arterial: 86.1 mmHg (ref 80.0–100.0)

## 2014-02-01 LAB — GLUCOSE, CAPILLARY
GLUCOSE-CAPILLARY: 92 mg/dL (ref 70–99)
GLUCOSE-CAPILLARY: 148 mg/dL — AB (ref 70–99)
Glucose-Capillary: 102 mg/dL — ABNORMAL HIGH (ref 70–99)
Glucose-Capillary: 105 mg/dL — ABNORMAL HIGH (ref 70–99)
Glucose-Capillary: 108 mg/dL — ABNORMAL HIGH (ref 70–99)

## 2014-02-01 LAB — TROPONIN I
Troponin I: 0.54 ng/mL (ref <0.30)
Troponin I: 0.42 ng/mL (ref <0.30)

## 2014-02-01 MED ORDER — ENOXAPARIN SODIUM 60 MG/0.6ML ~~LOC~~ SOLN
60.0000 mg | SUBCUTANEOUS | Status: DC
Start: 2014-02-01 — End: 2014-02-01

## 2014-02-01 MED ORDER — FUROSEMIDE 10 MG/ML IJ SOLN
80.0000 mg | Freq: Two times a day (BID) | INTRAMUSCULAR | Status: DC
Start: 2014-02-01 — End: 2014-02-01

## 2014-02-01 MED ORDER — FUROSEMIDE 10 MG/ML IJ SOLN: 40.0000 mg | Freq: Two times a day (BID) | INTRAMUSCULAR | Status: DC

## 2014-02-01 MED ORDER — FUROSEMIDE 10 MG/ML IJ SOLN
60.0000 mg | Freq: Two times a day (BID) | INTRAMUSCULAR | Status: DC
Start: 2014-02-01 — End: 2014-02-01
  Administered 2014-02-01: 60 mg via INTRAVENOUS
  Filled 2014-02-01: qty 6

## 2014-02-01 MED ORDER — FUROSEMIDE 10 MG/ML IJ SOLN
80.0000 mg | Freq: Every day | INTRAMUSCULAR | Status: DC
  Administered 2014-02-02 – 2014-02-03 (×2): 80 mg via INTRAVENOUS
  Filled 2014-02-01 (×2): qty 8

## 2014-02-01 MED ORDER — FUROSEMIDE 10 MG/ML IJ SOLN
80.0000 mg | INTRAMUSCULAR | Status: AC
  Administered 2014-02-01: 80 mg via INTRAVENOUS
  Filled 2014-02-01: qty 8

## 2014-02-01 MED ORDER — HEPARIN SODIUM (PORCINE) 5000 UNIT/ML IJ SOLN
5000.0000 [IU] | Freq: Three times a day (TID) | INTRAMUSCULAR | Status: DC
  Administered 2014-02-01 – 2014-02-03 (×7): 5000 [IU] via SUBCUTANEOUS
  Filled 2014-02-01 (×6): qty 1

## 2014-02-01 MED ORDER — NYSTATIN 100000 UNIT/GM EX CREA
TOPICAL_CREAM | Freq: Two times a day (BID) | CUTANEOUS | Status: DC
  Administered 2014-02-02 – 2014-02-03 (×4): via TOPICAL
  Filled 2014-02-01: qty 15

## 2014-02-01 NOTE — Progress Notes (Signed)
[facility] PROGRESS NOTE  Richard BooneSamuel L [name] ZOX:[id number] DOB: [birth year] DOA: August 15, 2013 PCP: [name],ASHISH, MD  Assessment/Plan: 1. Acutely decompensated systolic CHF exacerbation 1. Worsening pleural effusion on follow up CXR 2. Pt noted to be increasingly dyspneic 3. ABG is unremarkable 4. Pt on min O2 support 5. Increased lasix to 80mg  bid given pleural effusion and pt has since noted improvement in resp status, however remains with decreased urine output with concentrated appearing urine 6. Will cont on increased lasix for now and request the assistance of Cardiology for diuretic adjustments 2. Demand ischemia 1. Elevated trop on admit that has been trending down serially 2. Pt denies chest pain or nausea 3. Does have sob, but suspect is from mod-pleural effusion (see below) 4. Cont current meds 3. Acute on CKD 1. Cr up to 2.49 today, up from 2.17 on admit 2. Increased lasix secondary to sob (see above) 4. DM 1. On lantus 60 units daily with 15 units qhs 2. On SSI coverage 5. Ischemic cardiomyopathy 1. Troponin trending down 2. Will involve Cardiology per above 6. DVT prophylaxis 1. Heparin subQ  Code Status: DNR Family Communication: Pt in room Disposition Plan: Pending   Consultants:    Procedures:    Antibiotics:   (indicate start date, and stop date if known)  HPI/Subjective: Increasingly dyspneic earlier in the day. Follow up CXR with increased pleural effusions. Pt has since noted improvement in respiratory status s/p lasix  Objective: Filed Vitals:   2013-03-12 2043 02/01/14 0537 02/01/14 1303 02/01/14 1449  BP: 128/80 115/56 110/41 135/61  Pulse: 61 61 64 60  Temp: 97.6 F (36.4 C) 98.4 F (36.9 C) 98 F (36.7 C) 98 F (36.7 C)  TempSrc: Oral Oral Oral Oral  Resp: 20 21 26 20   Height: 6\' 1"  (1.854 m)     Weight: 123.81 kg (272 lb 15.2 oz)     SpO2: 100% 100% 100% 100%    Intake/Output Summary (Last 24 hours) at 02/01/14 1535 Last  data filed at 02/01/14 0538  Gross per 24 hour  Intake    480 ml  Output    130 ml  Net    350 ml   Filed Weights   2013-03-12 1550 2013-03-12 2043  Weight: 123.832 kg (273 lb) 123.81 kg (272 lb 15.2 oz)    Exam:   General:  Awake, in mild resp distress, but much improved from earlier in day  Cardiovascular: regular, s1, s2  Respiratory: increased resp effort, no wheezing, decreased bs throughout  Abdomen: soft, obese, nondistended  Musculoskeletal: perfused, no clubbing   Data Reviewed: Basic Metabolic Panel:  Recent Labs Lab 2013-03-12 1551 02/01/14 0725  NA 139 139  K 5.0 5.7*  CL 97 99  CO2 28 25  GLUCOSE 85 85  BUN 34* 42*  CREATININE 2.17* 2.49*  CALCIUM 8.9 8.8   Liver Function Tests: No results for input(s): AST, ALT, ALKPHOS, BILITOT, PROT, ALBUMIN in the last 168 hours. No results for input(s): LIPASE, AMYLASE in the last 168 hours. No results for input(s): AMMONIA in the last 168 hours. CBC:  Recent Labs Lab 2013-03-12 1551  WBC 9.4  NEUTROABS 7.1  HGB 11.5*  HCT 38.2*  MCV 92.5  PLT 341   Cardiac Enzymes:  Recent Labs Lab 2013-03-12 1551 2013-03-12 1959 02/01/14 0155 02/01/14 0715  TROPONINI 0.48* 0.56* 0.54* 0.42*   BNP (last 3 results)  Recent Labs  2013-03-12 1551  PROBNP 10467.0*   CBG:  Recent Labs Lab 2013-03-12 2039 02/01/14  1610 02/01/14 0734 02/01/14 1114  GLUCAP 68* 92 102* 105*    Recent Results (from the past 240 hour(s))  MRSA PCR Screening     Status: None   Collection Time: 01/10/2014  9:50 PM  Result Value Ref Range Status   MRSA by PCR NEGATIVE NEGATIVE Final    Comment:        The GeneXpert MRSA Assay (FDA approved for NASAL specimens only), is one component of a comprehensive MRSA colonization surveillance program. It is not intended to diagnose MRSA infection nor to guide or monitor treatment for MRSA infections.      Studies: Ct Chest Wo Contrast  02/01/2014   CLINICAL DATA:  Pleural effusion,  shortness of breath  EXAM: CT CHEST WITHOUT CONTRAST  TECHNIQUE: Multidetector CT imaging of the chest was performed following the standard protocol without IV contrast.  COMPARISON:  Chest radiographs dated 02/01/2014 at 1139 hr  FINDINGS: Motion degraded images.  Scattered mild patchy ground-glass opacities in the upper lobes, likely amount interstitial edema.  Moderate right and small to moderate left pleural effusions.  Associated bilateral lower lobe opacities, likely compressive atelectasis.  No pneumothorax.  Cardiomegaly. Trace pericardial fluid. Coronary atherosclerosis. Atherosclerotic calcifications of the aortic arch.  Small mediastinal lymph nodes.  Visualized upper abdomen is notable for a mildly nodular hepatic contour and upper abdominal ascites.  Degenerative changes of the visualized thoracolumbar spine.  IMPRESSION: Cardiomegaly with mild interstitial edema.  Moderate right and small to moderate left pleural effusions.  Associated bilateral lower lobe opacities, likely compressive atelectasis.   Electronically Signed   By: Charline Bills M.D.   On: 02/01/2014 15:16   Dg Chest Port 1 View  02/01/2014   CLINICAL DATA:  Shortness of breath, congestion, bilateral lower extremity swelling  EXAM: PORTABLE CHEST - 1 VIEW  COMPARISON:  01/29/2014  FINDINGS: Cardiomegaly with mild interstitial edema. Moderate right and small left pleural effusions. Associated bilateral lower lobe opacities, likely atelectasis.  No pneumothorax.  IMPRESSION: Cardiomegaly with mild interstitial edema.  Moderate right and small left pleural effusions.  Associated lower lobe opacities, likely atelectasis.   Electronically Signed   By: Charline Bills M.D.   On: 02/01/2014 13:54   Dg Chest Port 1 View  01/23/2014   CLINICAL DATA:  Shortness of breath and chest congestion. Swelling of the lower extremities today.  EXAM: PORTABLE CHEST - 1 VIEW  COMPARISON:  Chest x-rays dated 01/03/2014 and 09/04/2013  FINDINGS:  There is pulmonary vascular congestion with bilateral pleural effusions and slight pulmonary edema. Calcification is present in the arch of the aorta. No acute osseous abnormality.  IMPRESSION: Increased pulmonary vascular congestion with new small effusions and slight pulmonary edema at the bases. Findings are consistent with congestive heart failure.   Electronically Signed   By: Geanie Cooley M.D.   On: 01/20/2014 16:52    Scheduled Meds: . amiodarone  400 mg Oral BID  . amoxicillin  500 mg Oral TID  . aspirin  81 mg Oral Daily  . atorvastatin  80 mg Oral q1800  . enoxaparin (LOVENOX) injection  60 mg Subcutaneous Q24H  . [START ON 02/02/2014] furosemide  80 mg Intravenous Daily  . gabapentin  300 mg Oral BID  . insulin aspart  0-15 Units Subcutaneous TID WC  . insulin aspart  10 Units Subcutaneous Daily  . insulin glargine  15 Units Subcutaneous QHS  . insulin glargine  60 Units Subcutaneous Daily  . isosorbide mononitrate  30 mg Oral Daily  .  psyllium  1 packet Oral BID  . sodium chloride  3 mL Intravenous Q12H   Continuous Infusions:   Active Problems:   Congestive heart failure   Demand ischemia   Acute on chronic renal failure    Time spent: 30min    Eran Windish K  [facility] Pager [phone]. If 7PM-7AM, please contact night-coverage at [email or web address], password [email or web address] 02/01/2014, 3:35 PM  LOS: 1 day

## 2014-02-01 NOTE — Progress Notes (Signed)
MD notified of patients urine output of 200cc for this shift. Patient has foley in place. Bladder scan showed 0cc. Patient has no complaints at this time. MD placed no new orders will continue to monitor urine output.

## 2014-02-01 NOTE — Progress Notes (Signed)
  Echocardiogram 2D Echocardiogram has been performed.  Janalyn HarderWest, Dontavia Brand R 02/01/2014, 10:25 AM

## 2014-02-01 NOTE — Progress Notes (Signed)
Notified MD that patient was short of breath and was breathing 26 bpm. MD assessed patient and ordered a chest x-ray and ABG. Will continue to monitor patient at this time.

## 2014-02-01 NOTE — Plan of Care (Signed)
Problem: Phase I Progression Outcomes Goal: EF % per last Echo/documented,Core Reminder form on chart Outcome: Progressing

## 2014-02-01 NOTE — Plan of Care (Signed)
Problem: Phase I Progression Outcomes Goal: Dyspnea controlled at rest (HF) Outcome: Not Progressing

## 2014-02-02 ENCOUNTER — Inpatient Hospital Stay (HOSPITAL_COMMUNITY): Payer: Medicare Other

## 2014-02-02 DIAGNOSIS — I5020 Unspecified systolic (congestive) heart failure: Secondary | ICD-10-CM

## 2014-02-02 LAB — URINALYSIS, ROUTINE W REFLEX MICROSCOPIC
GLUCOSE, UA: NEGATIVE mg/dL
Ketones, ur: NEGATIVE mg/dL
Nitrite: POSITIVE — AB
PROTEIN: 100 mg/dL — AB
Specific Gravity, Urine: >1.03 — ABNORMAL HIGH (ref 1.005–1.030)
UROBILINOGEN UA: 1 mg/dL (ref 0.0–1.0)
pH: 6.5 (ref 5.0–8.0)

## 2014-02-02 LAB — CBC
HCT: 39 % (ref 39.0–52.0)
HEMOGLOBIN: 11.8 g/dL — AB (ref 13.0–17.0)
MCH: 27.8 pg (ref 26.0–34.0)
MCHC: 30.3 g/dL (ref 30.0–36.0)
MCV: 92 fL (ref 78.0–100.0)
PLATELETS: 339 10*3/uL (ref 150–400)
RBC: 4.24 MIL/uL (ref 4.22–5.81)
RDW: 16.8 % — ABNORMAL HIGH (ref 11.5–15.5)
WBC: 11.3 10*3/uL — ABNORMAL HIGH (ref 4.0–10.5)

## 2014-02-02 LAB — COMPREHENSIVE METABOLIC PANEL
ALT: 554 U/L — AB (ref 0–53)
ANION GAP: 12 (ref 5–15)
AST: 1022 U/L — ABNORMAL HIGH (ref 0–37)
Albumin: 2.5 g/dL — ABNORMAL LOW (ref 3.5–5.2)
Alkaline Phosphatase: 266 U/L — ABNORMAL HIGH (ref 39–117)
BUN: 54 mg/dL — AB (ref 6–23)
CO2: 28 meq/L (ref 19–32)
Calcium: 8.9 mg/dL (ref 8.4–10.5)
Chloride: 97 mEq/L (ref 96–112)
Creatinine, Ser: 2.9 mg/dL — ABNORMAL HIGH (ref 0.50–1.35)
GFR, EST AFRICAN AMERICAN: 22 mL/min — AB (ref >90)
GFR, EST NON AFRICAN AMERICAN: 19 mL/min — AB (ref >90)
GLUCOSE: 120 mg/dL — AB (ref 70–99)
Potassium: 5.9 mEq/L — ABNORMAL HIGH (ref 3.7–5.3)
Sodium: 137 mEq/L (ref 137–147)
TOTAL PROTEIN: 7.2 g/dL (ref 6.0–8.3)
Total Bilirubin: 1.1 mg/dL (ref 0.3–1.2)

## 2014-02-02 LAB — LACTATE DEHYDROGENASE, PLEURAL OR PERITONEAL FLUID: LD, Fluid: 115 U/L — ABNORMAL HIGH (ref 3–23)

## 2014-02-02 LAB — BODY FLUID CELL COUNT WITH DIFFERENTIAL
Eos, Fluid: 0 %
Lymphs, Fluid: 14 %
Monocyte-Macrophage-Serous Fluid: 34 % — ABNORMAL LOW (ref 50–90)
Neutrophil Count, Fluid: 52 % — ABNORMAL HIGH (ref 0–25)
Total Nucleated Cell Count, Fluid: 143 cu mm (ref 0–1000)

## 2014-02-02 LAB — URINE MICROSCOPIC-ADD ON

## 2014-02-02 LAB — GLUCOSE, CAPILLARY
GLUCOSE-CAPILLARY: 96 mg/dL (ref 70–99)
Glucose-Capillary: 104 mg/dL — ABNORMAL HIGH (ref 70–99)
Glucose-Capillary: 106 mg/dL — ABNORMAL HIGH (ref 70–99)

## 2014-02-02 LAB — BASIC METABOLIC PANEL
Anion gap: 12 (ref 5–15)
BUN: 55 mg/dL — ABNORMAL HIGH (ref 6–23)
CO2: 28 mEq/L (ref 19–32)
Calcium: 9 mg/dL (ref 8.4–10.5)
Chloride: 97 mEq/L (ref 96–112)
Creatinine, Ser: 2.91 mg/dL — ABNORMAL HIGH (ref 0.50–1.35)
GFR, EST AFRICAN AMERICAN: 22 mL/min — AB (ref >90)
GFR, EST NON AFRICAN AMERICAN: 19 mL/min — AB (ref >90)
Glucose, Bld: 123 mg/dL — ABNORMAL HIGH (ref 70–99)
POTASSIUM: 6 meq/L — AB (ref 3.7–5.3)
Sodium: 137 mEq/L (ref 137–147)

## 2014-02-02 LAB — PROTEIN, BODY FLUID: TOTAL PROTEIN, FLUID: 2.2 g/dL

## 2014-02-02 LAB — GLUCOSE, SEROUS FLUID: GLUCOSE FL: 115 mg/dL

## 2014-02-02 LAB — LACTATE DEHYDROGENASE: LDH: 554 U/L — ABNORMAL HIGH (ref 94–250)

## 2014-02-02 LAB — AMMONIA: AMMONIA: 31 umol/L (ref 11–60)

## 2014-02-02 NOTE — Clinical Social Work Psychosocial (Signed)
Clinical Social Work Department BRIEF PSYCHOSOCIAL ASSESSMENT 02/02/2014  Patient:  [name], [name]     Account Number:  [id number]     Admit date:  01/21/2014  Clinical Social Worker:  Wyatt Haste  Date/Time:  02/02/2014 11:41 AM  Referred by:  CSW  Date Referred:  02/02/2014  Other Referral:   Interview type:  Other - See comment Other interview type:   Friend- Angela Tilley    PSYCHOSOCIAL DATA Living Status:  FACILITY Admitted from facility:  [facility] Level of care:  [place] Primary support name:  [name] Primary support relationship to patient:  FRIEND Degree of support available:   supportive    CURRENT CONCERNS Current Concerns  Post-Acute Placement   Other Concerns:    SOCIAL WORK ASSESSMENT / PLAN CSW met with pt's friend, [name] at bedside. [name] reports that she and another friend are pt's best supports. He has an estranged brother and a sister who is in her 46s and lives out of state. Pt has been a resident at [facility] for many years. [name] reports that staff there are the closest thing to family for him. [name] also visits pt often. MD discussed goals of care with her today and they are open to hospice if recommended, but will take it day by day for right now. She feels if needed, pt would prefer to return to [facility] with hospice as opposed to [facility] since he is so familiar with everyone there. Per [name] at facility, pt is nursing level of care and okay to return.   Assessment/plan status:  Psychosocial Support/Ongoing Assessment of Needs Other assessment/ plan:   Information/referral to community resources:   To be determined    PATIENT'S/FAMILY'S RESPONSE TO PLAN OF CARE: CSW will follow up to discuss d/c plan further as needed.       Benay Pike, [place]

## 2014-02-02 NOTE — Procedures (Signed)
PreOperative Dx: RIGHT pleural effusion Postoperative Dx: RIGHT pleural effusion Procedure:   US guided RIGHT thoracentesis Radiologist:  Tyron RussellBoles Anesthesia:  10 ml of 1 lidocaine Specimen:  1400 ml of clear yellow colored fluid EBL:   < 1 ml Complications: None

## 2014-02-02 NOTE — Progress Notes (Signed)
Thoracentesis complete no signs of distress. 1400ml yellow colored pleural fluid removed 

## 2014-02-02 NOTE — Progress Notes (Signed)
UR chart review completed.  

## 2014-02-02 NOTE — Plan of Care (Signed)
Problem: Phase I Progression Outcomes Goal: Pain controlled with appropriate interventions Outcome: Completed/Met Date Met:  02/02/14     

## 2014-02-02 NOTE — Progress Notes (Signed)
Paged Dr earlier Richard Janskytonight because patients urine has progressively gotten darker and even this morning starting to look somewhat bloody. Dr said to continue monitoring and that the day time doctors would decide what to do after the cardiologist sees him today.

## 2014-02-02 NOTE — Consult Note (Signed)
Primary cardiologist: Dr. Jonelle Sidle Consulting cardiologist: Dr. Jonelle Sidle  Reason for consultation: Congestive heart failure, demand ischemia, aortic stenosis  Clinical Summary [name] is a medically complex 80 y.o.male with cardiac history outlined below, recently seen in the office on November 25. Now admitted to the hospital from nursing facility due to worsening leg edema as well as shortness of breath and reported weight gain. He was noted to be significantly edematous, CT imaging of the chest without contrast also showed cardiomegaly with mild interstitial edema and moderate right as well as small to moderate left pleural effusions. In association with this he has acute on chronic renal failure, creatinine is up to 2.9. Cardiac enzymes are also mildly abnormal in fairly flat pattern, 0.56, 0.54, 0.42. This is most suggestive of demand ischemia. ECGs reviewed with chronic abnormalities, recently in both junctional and sinus rhythm rather than atrial tachycardia as noted previously. Diffuse ST-T wave abnormalities, consider ischemic changes versus repolarization abnormalities.  He has history of ischemic cardiomyopathy that is being managed conservatively at his request without further invasive or device workup. It has been noted that he tends to be symptomatic with rapid atrial arrhythmias, which is why he is on amiodarone. He is not anticoagulated with history of recurrent bleeding. More recently he was symptomatically bradycardic and had to be taken off pindolol. He has low gradient aortic stenosis and also RV dysfunction with pulmonary hypertension.  Today he tells me that he feels "a little better," but is somnolent and appears short of breath. He denies any chest pain.  No Known Allergies  Medications Scheduled Medications: . amiodarone  400 mg Oral BID  . amoxicillin  500 mg Oral TID  . aspirin  81 mg Oral Daily  . atorvastatin  80 mg Oral q1800  . furosemide   80 mg Intravenous Daily  . gabapentin  300 mg Oral BID  . heparin subcutaneous  5,000 Units Subcutaneous 3 times per day  . insulin aspart  0-15 Units Subcutaneous TID WC  . insulin glargine  60 Units Subcutaneous Daily  . isosorbide mononitrate  30 mg Oral Daily  . nystatin cream   Topical BID  . psyllium  1 packet Oral BID  . sodium chloride  3 mL Intravenous Q12H    PRN Medications: sodium chloride, acetaminophen, ALPRAZolam, nitroGLYCERIN, ondansetron (ZOFRAN) IV, sodium chloride, traMADol   Past Medical History  Diagnosis Date  . Essential hypertension, benign   . Type 2 diabetes mellitus   . Coronary atherosclerosis of native coronary artery     Presumed and managed conservatively  . History of GI diverticular bleed   . History of gastric ulcer   . History of skin cancer   . Permanent atrial fibrillation   . NSTEMI (non-ST elevated myocardial infarction)   . Ischemic cardiomyopathy     LVEF 30-35%  . Orthostatic hypotension   . Acute kidney failure   . Anemia   . Aortic stenosis   . DNR (do not resuscitate)     Past Surgical History  Procedure Laterality Date  . Abdominal surgery      Family History  Problem Relation Age of Onset  . Hypertension Father     Social History Mr. Feick reports that he quit smoking about 26 years ago. His smoking use included Cigarettes. He started smoking about 66 years ago. He has a 117 pack-year smoking history. He has quit using smokeless tobacco. His smokeless tobacco use included Chew. Mr. Yoho reports  that he does not drink alcohol.  Review of Systems Complete review of systems negative except as otherwise outlined in the clinical summary and also the following. No recent palpitations, no chest pain or syncope.  Physical Examination Blood pressure 132/61, pulse 59, temperature 98.3 F (36.8 C), temperature source Oral, resp. rate 28, height 6\' 1"  (1.854 m), weight 273 lb 4.8 oz (123.968 kg), SpO2 98 %.  Intake/Output  Summary (Last 24 hours) at 02/02/14 0840 Last data filed at 02/02/14 0500  Gross per 24 hour  Intake    240 ml  Output    600 ml  Net   -360 ml   Telemetry: Sinus bradycardia and sinus rhythm.  Chronically ill-appearing male, edematous, appears short of breath. HEENT: Conjunctiva and lids normal, oropharynx clear with poor dentition. Neck: Supple, elevated JVP noted, no carotid bruits, no thyromegaly. Lungs: Scattered crackles with decreased breath sounds at the bases bilaterally, right greater than left, mildly labored breathing at rest. Cadiac: Regular rate and rhythm, no S3, 2/6 systolic murmur, no pericardial rub. Abdomen: Nontender, protuberant, bowel sounds present, no guarding or rebound. Extremities: Significant edema, arms and legs, distal pulses 1-2+. Skin: Warm and dry. Musculoskeletal: No kyphosis. Neuropsychiatric: Alert and oriented x 2-3, somewhat somnolent.   Lab Results  Basic Metabolic Panel:  Recent Labs Lab 12/12/13 1551 02/01/14 0725 02/02/14 0308  NA 139 139 137  137  K 5.0 5.7* 5.9*  6.0*  CL 97 99 97  97  CO2 28 25 28  28   GLUCOSE 85 85 120*  123*  BUN 34* 42* 54*  55*  CREATININE 2.17* 2.49* 2.90*  2.91*  CALCIUM 8.9 8.8 8.9  9.0    Liver Function Tests:  Recent Labs Lab 02/02/14 0308  AST 1022*  ALT 554*  ALKPHOS 266*  BILITOT 1.1  PROT 7.2  ALBUMIN 2.5*    CBC:  Recent Labs Lab 12/12/13 1551 02/02/14 0308  WBC 9.4 11.3*  NEUTROABS 7.1  --   HGB 11.5* 11.8*  HCT 38.2* 39.0  MCV 92.5 92.0  PLT 341 339    Cardiac Enzymes:  Recent Labs Lab 12/12/13 1551 12/12/13 1959 02/01/14 0155 02/01/14 0715  TROPONINI 0.48* 0.56* 0.54* 0.42*    Pro-BNP: 10467   Echocardiogram (02/01/2014): Study Conclusions  - Left ventricle: The cavity size was normal. There was moderate concentric hypertrophy. The estimated ejection fraction was 40%. Diffuse hypokinesis. There is severe hypokinesis of  the basal-midinferoseptal myocardium. Doppler parameters are consistent with restrictive physiology, indicative of decreased left ventricular diastolic compliance and/or increased left atrial pressure. - Ventricular septum: The contour showed diastolic flattening and systolic flattening. - Aortic valve: Moderately calcified annulus. Trileaflet; moderately calcified leaflets. Cusp separation was severely reduced. There was probable low gradient, severe stenosis. Mean gradient (S): 15 mm Hg. VTI ratio of LVOT to aortic valve: 0.28. Valve area (VTI): 0.8 cm^2. Valve area (Vmax): 1 cm^2. - Mitral valve: Calcified annulus. Mildly thickened leaflets . There was moderate regurgitation. - Left atrium: The atrium was moderately dilated. - Right ventricle: The cavity size was moderately dilated. Systolic function was severely reduced. - Right atrium: The atrium was moderately to severely dilated. - Tricuspid valve: There was moderate regurgitation. Peak RV-RA gradient (S): 33 mm Hg. - Pulmonary arteries: Systolic pressure could not be accurately estimated. - Pericardium, extracardiac: A trivial pericardial effusion was identified posterior to the heart. There was a left pleural effusion.  Impressions:  - Moderate LVH with LVEF approximately 40%, restrictive diastolic filling pattern with  increased left atrial pressure. MAC with moderate mitral regurgitation. Moderate left atrial enlargement. Probable low gradient, severe calcific aortic stenosis as outlined above. LV septal flattening consistent with RV volume and pressure overload, Severe RV dysfunction. Moderate tricuspid regurgitation. Unable to adequately assess PASP. Trivial pericardial effusion. Left pleural effusion noted.   Impression  1. Presentation with progressing overload and congestive heart failure, acute on chronic systolic heart failure complicated by low gradient severe  aortic stenosis, atrial arrhythmias, and acute renal failure. Hepatic enzymes also increased, question secondary hepatic congestion related to above, also on amiodarone however. He has had a gradually progressive course over the last year, and treatment options are limited.  2. Ischemic heart disease, has been managed conservatively at patient request. Troponin I levels are consistent with demand ischemia.  3. Cardiomyopathy, recent echocardiogram documenting LVEF 40% with restrictive diastolic filling pattern.  4. Recurrent atrial arrhythmias including atrial tachycardia and atrial fibrillation/flutter. He has not been anticoagulated with history of recurrent bleeding. Managed with amiodarone, has not been able to tolerate beta blocker due to bradycardia.  5. Severe, low gradient aortic stenosis. This has also been managed conservatively at patient request.  6. Acute on chronic renal failure, creatinine up to 2.9. Also hyperkalemic.  7. Type 2 diabetes mellitus.   Recommendations  Patient's prognosis is quite poor, he has had a declining course, and treatment options are limited at this time. He has declined invasive workup as noted above. Not a candidate for inotropic support. With progressing renal failure, volume management is going to be quite difficult, and I doubt that he would even be considered for hemodialysis or UF, even temporarily. Will review his medications and make adjustments as able. For now would hold amiodarone, try and continue IV diuretic. He is DO NOT RESUSCITATE - would recommend evaluation for hospice care.  Jonelle SidleSamuel G. Clint Strupp, M.D., F.A.C.C.

## 2014-02-02 NOTE — Progress Notes (Signed)
[facility] PROGRESS NOTE  Elita BooneSamuel L Ingber ZOX:[id number] DOB: [birth year] DOA: 10/17/13 PCP: [name],ASHISH, MD  Assessment/Plan: 1. Acutely decompensated systolic CHF exacerbation 1. Worsening pleural effusion on follow up CXR 2. Pt noted to be increasingly dyspneic 3. ABG is unremarkable 4. Pt on min O2 support 5. Increased lasix to 80mg  6. Pt however remains with decreased urine output and concentrated appearing urine 7. Appreciate input from Cardiology 8. Overall, this is a very unfortunate situation with poor long-term prognosis 2. Demand ischemia 1. Elevated trop on admit that has been trending down serially 2. Pt denies chest pain or nausea 3. Does have sob, but suspect is from mod-pleural effusion (see below) 4. Cont current meds 3. Acute on CKD 1. Cr up to 2.9 today, up from 2.17 on admit 2. Increased lasix secondary to sob (see above) 3. Follow closely 4. DM 1. On lantus 60 units daily with 15 units qhs 2. On SSI coverage 5. Ischemic cardiomyopathy 1. Troponin trending down 2. Appreciate Cardiology input 6. DVT prophylaxis 1. Heparin subQ  Code Status: DNR Family Communication: Pt in room Disposition Plan: Pending   Consultants:    Procedures:    Antibiotics:     HPI/Subjective: Pt remains sob this AM. No acute events were noted overnight.   Objective: Filed Vitals:   02/01/14 2111 02/01/14 2247 02/02/14 0642 02/02/14 1318  BP: 119/54  132/61 130/56  Pulse: 59  59 57  Temp: 98.5 F (36.9 C)  98.3 F (36.8 C) 97.9 F (36.6 C)  TempSrc: Oral  Oral Oral  Resp: 21 30 28 20   Height:      Weight: 123.968 kg (273 lb 4.8 oz)  123.968 kg (273 lb 4.8 oz)   SpO2: 100%  98% 100%    Intake/Output Summary (Last 24 hours) at 02/02/14 1515 Last data filed at 02/02/14 0500  Gross per 24 hour  Intake    240 ml  Output    600 ml  Net   -360 ml   Filed Weights   07/04/13 2043 02/01/14 2111 02/02/14 0642  Weight: 123.81 kg (272 lb 15.2 oz)  123.968 kg (273 lb 4.8 oz) 123.968 kg (273 lb 4.8 oz)    Exam:   General:  Awake, in mild resp distress, but much improved from earlier in day  Cardiovascular: regular, s1, s2  Respiratory: increased resp effort, no wheezing, decreased bs throughout  Abdomen: soft, obese, nondistended  Musculoskeletal: perfused, no clubbing   Data Reviewed: Basic Metabolic Panel:  Recent Labs Lab 07/04/13 1551 02/01/14 0725 02/02/14 0308  NA 139 139 137  137  K 5.0 5.7* 5.9*  6.0*  CL 97 99 97  97  CO2 28 25 28  28   GLUCOSE 85 85 120*  123*  BUN 34* 42* 54*  55*  CREATININE 2.17* 2.49* 2.90*  2.91*  CALCIUM 8.9 8.8 8.9  9.0   Liver Function Tests:  Recent Labs Lab 02/02/14 0308  AST 1022*  ALT 554*  ALKPHOS 266*  BILITOT 1.1  PROT 7.2  ALBUMIN 2.5*   No results for input(s): LIPASE, AMYLASE in the last 168 hours. No results for input(s): AMMONIA in the last 168 hours. CBC:  Recent Labs Lab 07/04/13 1551 02/02/14 0308  WBC 9.4 11.3*  NEUTROABS 7.1  --   HGB 11.5* 11.8*  HCT 38.2* 39.0  MCV 92.5 92.0  PLT 341 339   Cardiac Enzymes:  Recent Labs Lab 07/04/13 1551 07/04/13 1959 02/01/14 0155 02/01/14 0715  TROPONINI 0.48* 0.56*  0.54* 0.42*   BNP (last 3 results)  Recent Labs  Jun 29, 2013 1551  PROBNP 10467.0*   CBG:  Recent Labs Lab 02/01/14 1114 02/01/14 1641 02/01/14 2113 02/02/14 0759 02/02/14 1150  GLUCAP 105* 108* 148* 104* 106*    Recent Results (from the past 240 hour(s))  MRSA PCR Screening     Status: None   Collection Time: Jun 29, 2013  9:50 PM  Result Value Ref Range Status   MRSA by PCR NEGATIVE NEGATIVE Final    Comment:        The GeneXpert MRSA Assay (FDA approved for NASAL specimens only), is one component of a comprehensive MRSA colonization surveillance program. It is not intended to diagnose MRSA infection nor to guide or monitor treatment for MRSA infections.      Studies: Ct Chest Wo Contrast  02/01/2014    CLINICAL DATA:  Pleural effusion, shortness of breath  EXAM: CT CHEST WITHOUT CONTRAST  TECHNIQUE: Multidetector CT imaging of the chest was performed following the standard protocol without IV contrast.  COMPARISON:  Chest radiographs dated 02/01/2014 at 1139 hr  FINDINGS: Motion degraded images.  Scattered mild patchy ground-glass opacities in the upper lobes, likely amount interstitial edema.  Moderate right and small to moderate left pleural effusions.  Associated bilateral lower lobe opacities, likely compressive atelectasis.  No pneumothorax.  Cardiomegaly. Trace pericardial fluid. Coronary atherosclerosis. Atherosclerotic calcifications of the aortic arch.  Small mediastinal lymph nodes.  Visualized upper abdomen is notable for a mildly nodular hepatic contour and upper abdominal ascites.  Degenerative changes of the visualized thoracolumbar spine.  IMPRESSION: Cardiomegaly with mild interstitial edema.  Moderate right and small to moderate left pleural effusions.  Associated bilateral lower lobe opacities, likely compressive atelectasis.   Electronically Signed   By: Charline BillsSriyesh  Krishnan M.D.   On: 02/01/2014 15:16   Dg Chest Port 1 View  02/01/2014   CLINICAL DATA:  Shortness of breath, congestion, bilateral lower extremity swelling  EXAM: PORTABLE CHEST - 1 VIEW  COMPARISON:  2014-01-19  FINDINGS: Cardiomegaly with mild interstitial edema. Moderate right and small left pleural effusions. Associated bilateral lower lobe opacities, likely atelectasis.  No pneumothorax.  IMPRESSION: Cardiomegaly with mild interstitial edema.  Moderate right and small left pleural effusions.  Associated lower lobe opacities, likely atelectasis.   Electronically Signed   By: Charline BillsSriyesh  Krishnan M.D.   On: 02/01/2014 13:54   Dg Chest Port 1 View  2014-01-19   CLINICAL DATA:  Shortness of breath and chest congestion. Swelling of the lower extremities today.  EXAM: PORTABLE CHEST - 1 VIEW  COMPARISON:  Chest x-rays dated  01/03/2014 and 09/04/2013  FINDINGS: There is pulmonary vascular congestion with bilateral pleural effusions and slight pulmonary edema. Calcification is present in the arch of the aorta. No acute osseous abnormality.  IMPRESSION: Increased pulmonary vascular congestion with new small effusions and slight pulmonary edema at the bases. Findings are consistent with congestive heart failure.   Electronically Signed   By: Geanie CooleyJim  Maxwell M.D.   On: 2014-01-19 16:52    Scheduled Meds: . amoxicillin  500 mg Oral TID  . aspirin  81 mg Oral Daily  . atorvastatin  80 mg Oral q1800  . furosemide  80 mg Intravenous Daily  . gabapentin  300 mg Oral BID  . heparin subcutaneous  5,000 Units Subcutaneous 3 times per day  . insulin aspart  0-15 Units Subcutaneous TID WC  . insulin glargine  60 Units Subcutaneous Daily  . isosorbide mononitrate  30 mg  Oral Daily  . nystatin cream   Topical BID  . psyllium  1 packet Oral BID  . sodium chloride  3 mL Intravenous Q12H   Continuous Infusions:   Active Problems:   Congestive heart failure   Demand ischemia   Acute on chronic renal failure    Time spent:    Carline Dura K  [facility] Pager [phone]. If 7PM-7AM, please contact night-coverage at [email or web address], password [facility] 02/02/2014, 3:15 PM  LOS: 2 days

## 2014-02-02 NOTE — Care Management Note (Signed)
    Page 1 of 1   02/02/2014     10:20:03 AM CARE MANAGEMENT NOTE 02/02/2014  Patient:  [name],[name]   Account Number:  [id number]  Date Initiated:  02/02/2014  Documentation initiated by:  Sharrie RothmanBLACKWELL,[name]  Subjective/Objective Assessment:   [facility]. Pt will return to facility at discharge.     Action/Plan:   CSW is aware and will arrange discharge to facility when medically stable.   Anticipated DC Date:  02/05/2014   Anticipated DC Plan:  SKILLED NURSING FACILITY  In-house referral  Clinical Social Worker      DC Planning Services  CM consult      Choice offered to / List presented to:             Status of service:  Completed, signed off Medicare Important Message given?   (If response is "NO", the following Medicare IM given date fields will be blank) Date Medicare IM given:   Medicare IM given by:   Date Additional Medicare IM given:   Additional Medicare IM given by:    Discharge Disposition:  SKILLED NURSING FACILITY  Per UR Regulation:    If discussed at Long Length of Stay Meetings, dates discussed:    Comments:  02/02/14 1020 Arlyss Queenammy Kamylah Manzo, RN BSN CM

## 2014-02-03 ENCOUNTER — Encounter (HOSPITAL_COMMUNITY): Payer: Self-pay | Admitting: Gastroenterology

## 2014-02-03 DIAGNOSIS — K7460 Unspecified cirrhosis of liver: Secondary | ICD-10-CM

## 2014-02-03 DIAGNOSIS — I509 Heart failure, unspecified: Secondary | ICD-10-CM

## 2014-02-03 LAB — CBC
HCT: 36.1 % — ABNORMAL LOW (ref 39.0–52.0)
HEMOGLOBIN: 10.8 g/dL — AB (ref 13.0–17.0)
MCH: 27.1 pg (ref 26.0–34.0)
MCHC: 29.9 g/dL — AB (ref 30.0–36.0)
MCV: 90.7 fL (ref 78.0–100.0)
Platelets: 296 10*3/uL (ref 150–400)
RBC: 3.98 MIL/uL — AB (ref 4.22–5.81)
RDW: 16.8 % — ABNORMAL HIGH (ref 11.5–15.5)
WBC: 8.5 10*3/uL (ref 4.0–10.5)

## 2014-02-03 LAB — COMPREHENSIVE METABOLIC PANEL
ALBUMIN: 2.2 g/dL — AB (ref 3.5–5.2)
ALK PHOS: 232 U/L — AB (ref 39–117)
ALT: 409 U/L — ABNORMAL HIGH (ref 0–53)
ANION GAP: 10 (ref 5–15)
AST: 479 U/L — ABNORMAL HIGH (ref 0–37)
BUN: 64 mg/dL — AB (ref 6–23)
CALCIUM: 8.8 mg/dL (ref 8.4–10.5)
CO2: 31 mEq/L (ref 19–32)
CREATININE: 2.8 mg/dL — AB (ref 0.50–1.35)
Chloride: 100 mEq/L (ref 96–112)
GFR calc non Af Amer: 20 mL/min — ABNORMAL LOW (ref >90)
GFR, EST AFRICAN AMERICAN: 23 mL/min — AB (ref >90)
GLUCOSE: 51 mg/dL — AB (ref 70–99)
Potassium: 5.1 mEq/L (ref 3.7–5.3)
Sodium: 141 mEq/L (ref 137–147)
Total Bilirubin: 1.1 mg/dL (ref 0.3–1.2)
Total Protein: 6.6 g/dL (ref 6.0–8.3)

## 2014-02-03 LAB — GLUCOSE, CAPILLARY
GLUCOSE-CAPILLARY: 55 mg/dL — AB (ref 70–99)
Glucose-Capillary: 88 mg/dL (ref 70–99)
Glucose-Capillary: 71 mg/dL (ref 70–99)
Glucose-Capillary: 96 mg/dL (ref 70–99)
Glucose-Capillary: 75 mg/dL (ref 70–99)
Glucose-Capillary: 44 mg/dL — CL (ref 70–99)

## 2014-02-03 LAB — PROTIME-INR
INR: 1.45 (ref 0.00–1.49)
Prothrombin Time: 17.8 seconds — ABNORMAL HIGH (ref 11.6–15.2)

## 2014-02-03 LAB — GLUCOSE, RANDOM: GLUCOSE: 59 mg/dL — AB (ref 70–99)

## 2014-02-03 LAB — HEPATITIS PANEL, ACUTE
HCV Ab: NEGATIVE
HEP B S AG: NEGATIVE
Hep A IgM: NONREACTIVE
Hep B C IgM: NONREACTIVE

## 2014-02-03 MED ORDER — PANTOPRAZOLE SODIUM 40 MG PO TBEC
40.0000 mg | DELAYED_RELEASE_TABLET | Freq: Two times a day (BID) | ORAL | Status: DC
  Administered 2014-02-03: 40 mg via ORAL
  Filled 2014-02-03: qty 1

## 2014-02-03 NOTE — Plan of Care (Signed)
Problem: Consults Goal: Diabetes Guidelines if Diabetic/Glucose > 140 If diabetic or lab glucose is > 140 mg/dl - Initiate Diabetes/Hyperglycemia Guidelines & Document Interventions  Outcome: Completed/Met Date Met:  02/03/14

## 2014-02-03 NOTE — Progress Notes (Addendum)
[facility] PROGRESS NOTE  Richard BooneSamuel L Wauneka XLK:[id number] DOB: 04/26/1930 DOA: 01/11/2014 PCP: [name],ASHISH, MD   Off Service Summary [phone] who initially presented to the ED with SOB with concerns for acute hear failure. The patient was admitted and continued on aggressive diuretics. Imaging noted B pleural effusions, moderate on the R. The patient underwent diagnostic and therapeutic thoracentesis on 11/30 which revealed a transudative fluid. Incidentally, the patient was found to have markedly elevated LFT's with follow up RUQ US demonstrating evidence of cirrhosis. GI was consulted. Of note, the patient did have elevated cardiac biomarkers, that were felt to be secondary to demand mismatch per Cardiology. No invasive cardiac testing is planned. This is a very unfortunate situation and ultimately the patient may benefit from Hospice.  Assessment/Plan: 1. Acutely decompensated systolic CHF exacerbation 1. Worsening pleural effusion on follow up on presenting CXR 2. Pt noted to be increasingly dyspneic 3. ABG was unremarkable 4. Had been cont on min O2 support 5. Increased lasix to 80mg  6. Pt however remains with decreased urine output and concentrated appearing urine 7. Appreciate input from Cardiology 8. Overall, this is a very unfortunate situation with poor prognosis 2. Demand ischemia 1. Elevated trop on admit that has been trending down serially 2. Pt denies chest pain or nausea 3. Does have sob, but suspect is from volume overload from CHF vs ascites (see above and below) 4. Cont current meds 3. Acute on CKD 1. Cr stable at 2.8 today, was 2.17 on admit 2. Increased lasix secondary to sob (see above) 3. Remains on 80mg  IV lasix 4. Follow closely 4. DM 1. On lantus 60 units daily with 15 units qhs 2. On SSI coverage 5. Ischemic cardiomyopathy 1. Troponin trending down 2. Appreciate Cardiology input 6. DVT prophylaxis 1. Heparin subQ 7. Cirrhosis 1. Markedly elevated  LFT's with RUQ US findings of cirrhosis 2. Acute hepatitis panel neg 3. Ammonia levels normal 4. Consulted GI and appreciate input 5. Autoimmune panel, PBC serologies and repeat liver function in AM recommended 6. Avoid hepatotoxic agents 7. Recs for adding albumin to lasix to aid in diuresis  Code Status: DNR - does not tolerate bipap Family Communication: Pt in room Disposition Plan: Pending   Consultants:    Procedures:    Antibiotics:     HPI/Subjective: Pt denies feeling sob. No acute events noted overnight.  Objective: Filed Vitals:   02/02/14 2200 02/03/14 0434 02/03/14 0442 02/03/14 1441  BP: 96/62  115/46 121/37  Pulse:   66 63  Temp:   98.2 F (36.8 C) 98.3 F (36.8 C)  TempSrc:   Oral Oral  Resp:   20 20  Height:      Weight:  119.4 kg (263 lb 3.7 oz)    SpO2:   99% 99%    Intake/Output Summary (Last 24 hours) at 02/03/14 1509 Last data filed at 02/03/14 1440  Gross per 24 hour  Intake    480 ml  Output   1250 ml  Net   -770 ml   Filed Weights   02/01/14 2111 02/02/14 0642 02/03/14 0434  Weight: 123.968 kg (273 lb 4.8 oz) 123.968 kg (273 lb 4.8 oz) 119.4 kg (263 lb 3.7 oz)    Exam:   General:  Awake, in mild resp distress, but much improved from earlier in day  Cardiovascular: regular, s1, s2  Respiratory: increased resp effort, no wheezing, decreased bs throughout  Abdomen: soft, obese, nondistended  Musculoskeletal: perfused, no clubbing   Data Reviewed: Basic  Metabolic Panel:  Recent Labs Lab 05/30/2013 1551 02/01/14 0725 02/02/14 0308 02/03/14 0558  NA 139 139 137  137 141  K 5.0 5.7* 5.9*  6.0* 5.1  CL 97 99 97  97 100  CO2 28 25 28  28 31   GLUCOSE 85 85 120*  123* 51*  BUN 34* 42* 54*  55* 64*  CREATININE 2.17* 2.49* 2.90*  2.91* 2.80*  CALCIUM 8.9 8.8 8.9  9.0 8.8   Liver Function Tests:  Recent Labs Lab 02/02/14 0308 02/03/14 0558  AST 1022* 479*  ALT 554* 409*  ALKPHOS 266* 232*  BILITOT 1.1 1.1   PROT 7.2 6.6  ALBUMIN 2.5* 2.2*   No results for input(s): LIPASE, AMYLASE in the last 168 hours.  Recent Labs Lab 02/02/14 1830  AMMONIA 31   CBC:  Recent Labs Lab 05/30/2013 1551 02/02/14 0308 02/03/14 0558  WBC 9.4 11.3* 8.5  NEUTROABS 7.1  --   --   HGB 11.5* 11.8* 10.8*  HCT 38.2* 39.0 36.1*  MCV 92.5 92.0 90.7  PLT 341 339 296   Cardiac Enzymes:  Recent Labs Lab 05/30/2013 1551 05/30/2013 1959 02/01/14 0155 02/01/14 0715  TROPONINI 0.48* 0.56* 0.54* 0.42*   BNP (last 3 results)  Recent Labs  05/30/2013 1551  PROBNP 10467.0*   CBG:  Recent Labs Lab 02/02/14 1651 02/02/14 2115 02/03/14 0743 02/03/14 0806 02/03/14 1143  GLUCAP 96 88 55* 71 96    Recent Results (from the past 240 hour(s))  MRSA PCR Screening     Status: None   Collection Time: 05/30/2013  9:50 PM  Result Value Ref Range Status   MRSA by PCR NEGATIVE NEGATIVE Final    Comment:        The GeneXpert MRSA Assay (FDA approved for NASAL specimens only), is one component of a comprehensive MRSA colonization surveillance program. It is not intended to diagnose MRSA infection nor to guide or monitor treatment for MRSA infections.   Body fluid culture     Status: None (Preliminary result)   Collection Time: 02/02/14  4:15 PM  Result Value Ref Range Status   Specimen Description FLUID RIGHT PLEURAL  Final   Special Requests NONE  Final   Gram Stain   Final    NO WBC SEEN NO ORGANISMS SEEN Performed at [facility]    Culture PENDING  Incomplete   Report Status PENDING  Incomplete  Anaerobic culture     Status: None (Preliminary result)   Collection Time: 02/02/14  4:15 PM  Result Value Ref Range Status   Specimen Description FLUID RIGHT PLEURAL  Final   Special Requests NONE  Final   Gram Stain   Final    NO WBC SEEN NO ORGANISMS SEEN Performed at [facility]    Culture   Final    NO ANAEROBES ISOLATED; CULTURE IN PROGRESS FOR 5 DAYS Performed at [facility]    Report Status PENDING  Incomplete     Studies: Dg Chest 1 View  02/02/2014   CLINICAL DATA:  RIGHT pleural effusion post thoracentesis  EXAM: CHEST - 1 VIEW  COMPARISON:  02/01/2014  FINDINGS: Markedly decreased RIGHT pleural effusion and basilar atelectasis post thoracentesis.  No pneumothorax.  Enlargement of cardiac silhouette with pulmonary vascular congestion.  Improved pulmonary edema.  Persistent small LEFT pleural effusion and basilar atelectasis.  No acute osseous findings.  IMPRESSION: Improved CHF with persistent LEFT basilar effusion and atelectasis.  Decreased RIGHT pleural effusion and basilar  atelectasis post thoracentesis without pneumothorax.   Electronically Signed   By: Ulyses Southward M.D.   On: 02/02/2014 16:38   US Abdomen Complete  02/02/2014   CLINICAL DATA:  80 year old male with abdominal pain, elevated LFTs and chronic abdominal distention  EXAM: ULTRASOUND ABDOMEN COMPLETE  COMPARISON:  Prior CT abdomen/ pelvis 02/06/2013  FINDINGS: Gallbladder: Mobile echogenic foci with posterior acoustic shadowing are present within the gallbladder lumen consistent with choledocholithiasis. Per the sonographer, the sonographic Eulah Pont sign was negative. There is mild thickening of the gallbladder wall which measures up to 7 mm. The gallbladder is not particularly distended.  Common bile duct: Diameter: Within normal limits at 3 mm  Liver: Dense hepatic parenchyma which is echogenic and coarsened. No discrete lesion identified. The gallbladder is crests that the liver is enlarged at nearly 20 cm in craniocaudal dimension and the contour appears slightly nodular. Overall, the findings are suspicious for cirrhosis.  IVC: No abnormality visualized.  Pancreas: Not well seen secondary to obscuring bowel gas. Visualized portion unremarkable.  Spleen: The spleen is enlarged with an estimated volume of 599 cubic cm (411 cubic cm is the upper limit of normal)  Right Kidney: Length: 11.1 cm. No  hydronephrosis. Normal parenchymal echogenicity. Mild diffuse cortical thinning.  Left Kidney: Length: 11.6 cm. No hydronephrosis. Normal parenchymal echogenicity. Mild diffuse cortical thinning.  Abdominal aorta: Not seen secondary to obscuring bowel gas.  Other findings: Large right pleural effusion. Mild to moderate perihepatic ascites.  IMPRESSION: 1. Sonographic findings are suggestive of hepatic cirrhosis with portal hypertension (ascites and splenomegaly). 2. Cholelithiasis with gallbladder wall thickening. Gallbladder wall thickening is indeterminate in the setting of cirrhosis and can be related to hypoalbuminemia, right heart failure and intrinsic liver disease as well acute or chronic cholecystitis. Sonographic Murphy sign was negative. 3. Large right pleural effusion. 4. Bilateral renal cortical thinning.   Electronically Signed   By: Malachy Moan M.D.   On: 02/02/2014 16:33   US Pelvis Limited  02/02/2014   CLINICAL DATA:  Chronic abdominal distention, abdominal pain  EXAM: LIMITED ULTRASOUND OF PELVIS  TECHNIQUE: Limited transabdominal ultrasound examination of the pelvis was performed.  COMPARISON:  None.  FINDINGS: Real-time sonographic evaluation of the pelvis and lower abdomen demonstrates no free fluid.  IMPRESSION: No evidence of free fluid in the lower abdomen and pelvis.   Electronically Signed   By: Elige Ko   On: 02/02/2014 16:35   US Thoracentesis Asp Pleural Space W/img Guide  02/02/2014   CLINICAL DATA:  BILATERAL pleural effusions greater on RIGHT, history hypertension, type 2 diabetes, coronary artery disease post MI, atrial fibrillation, ischemic cardiomyopathy, CHF  EXAM: ULTRASOUND GUIDED DIAGNOSTIC AND THERAPEUTIC THORACENTESIS  COMPARISON:  Chest CT 02/01/2014  PROCEDURE: Procedure, benefits, and risks of procedure were discussed with patient.  Written informed consent for procedure was obtained.  Time out protocol followed.  Pleural effusion localized by ultrasound  at the posterior RIGHT hemithorax.  Skin prepped and draped in usual sterile fashion.  Skin and soft tissues anesthetized with 10 of 1% lidocaine.  8 French thoracentesis catheter placed into the RIGHT pleural space.  1400 mL of clear yellow fluid aspirated by syringe pump.  Procedure tolerated well by patient without immediate complication.  FINDINGS: A total of approximately 1400 mL of RIGHT pleural fluid was removed. A fluid sample of 180 mL was sent for laboratory analysis.  IMPRESSION: Successful ultrasound guided RIGHT thoracentesis yielding 1400 mL of pleural fluid.   Electronically Signed   By:  Ulyses Southward M.D.   On: 02/02/2014 16:37    Scheduled Meds: . amoxicillin  500 mg Oral TID  . aspirin  81 mg Oral Daily  . furosemide  80 mg Intravenous Daily  . gabapentin  300 mg Oral BID  . heparin subcutaneous  5,000 Units Subcutaneous 3 times per day  . insulin aspart  0-15 Units Subcutaneous TID WC  . insulin glargine  60 Units Subcutaneous Daily  . isosorbide mononitrate  30 mg Oral Daily  . nystatin cream   Topical BID  . pantoprazole  40 mg Oral BID AC  . psyllium  1 packet Oral BID  . sodium chloride  3 mL Intravenous Q12H   Continuous Infusions:   Active Problems:   Congestive heart failure   Demand ischemia   Acute on chronic renal failure   Congestive heart disease   Cirrhosis  Time spent:  Daemon Dowty K  [facility] Pager [phone]. If 7PM-7AM, please contact night-coverage at [email or web address], password [facility] 02/03/2014, 3:09 PM  LOS: 3 days

## 2014-02-03 NOTE — Progress Notes (Signed)
Hypoglycemic Event  CBG: 55  Treatment: 15 GM carbohydrate snack  Symptoms: None  Follow-up CBG: Time:0806 CBG Result:71  Possible Reasons for Event: Unknown  Comments/MD notified:Yes       Abigayle Wilinski L  Remember to initiate Hypoglycemia Order Set & complete

## 2014-02-03 NOTE — Plan of Care (Signed)
Problem: Consults Goal: Skin Care Protocol Initiated - if Braden Score 18 or less If consults are not indicated, leave blank or document N/A  Outcome: Completed/Met Date Met:  02/03/14     

## 2014-02-03 NOTE — Consult Note (Signed)
Referring Provider: Dr. Wyline Copas Primary Care Physician:  Monico Blitz, MD Primary Gastroenterologist:  Dr. Oneida Alar  Date of Admission: 01/19/2014 Date of Consultation: 02/03/14  Reason for Consultation:  Newly diagnosed cirrhosis  HPI:  [name] is an 80 year old male admitted with worsening lower extremity edema, weight gain, acute on chronic renal failure, elevated cardiac enzymes consistent with demand ischemia. Imaging studies revealed cirrhosis with portal hypertension, cholelithiasis with gallbladder wall thickening in the setting of cirrhosis. He underwent a thoracentesis secondary to large pleural effusion. Elevated transaminases and alk phos noted, with improvement noted since admission.   Feels worse than when originally admitted. Feels "so bad" all over. Hands shaky. Feels like breathing is "alright". Notes difficulty with eating, stating food gets "stuck in my throat" and has to "throw it out". Mid-esophagus. Notes a few episodes at the [facility]. Symptoms for the past few days. No odynophagia. +nausea. Denies reflux symptoms. No overt bleeding. No BM in 3-4 days. No PPI as outpatient. No melena or hematochezia.    2012 diverticular bleed, colonoscopy at [facility]. Reports EGD around that time. I do not have reports. Upon review of medical records, some question of prior history of gastric ulcers requiring abdominal surgery in the 1990s. Patient is a poor historian. Denies any ETOH abuse. Viral markers negative. States he has never been told he had cirrhosis. Difficulty ambulating at [facility], uses wheelchair to help with ambulation.   Past Medical History  Diagnosis Date  . Essential hypertension, benign   . Type 2 diabetes mellitus   . Coronary atherosclerosis of native coronary artery     Presumed and managed conservatively  . History of GI diverticular bleed   . History of gastric ulcer   . History of skin cancer   . Permanent atrial fibrillation   . NSTEMI  (non-ST elevated myocardial infarction)   . Ischemic cardiomyopathy     LVEF 30-35%  . Orthostatic hypotension   . Acute kidney failure   . Anemia   . Aortic stenosis   . DNR (do not resuscitate)     Past Surgical History  Procedure Laterality Date  . Abdominal surgery      1994, possibly secondary to gastric ulcer? Unknown  . Colonoscopy  2012    per review of consult note in 2012 by Dr. Paulita Fujita: pandiverticulosis, bleeding diverticula in proximal transverse colon s/p clips    Prior to Admission medications   Medication Sig Start Date End Date Taking? Authorizing Provider  acetaminophen (TYLENOL) 325 MG tablet Take 650 mg by mouth every 4 (four) hours as needed.   Yes Historical Provider, MD  amiodarone (PACERONE) 200 MG tablet Take 2 tablets (400 mg total) by mouth 2 (two) times daily. Then resume 200 mg twice daily 01/28/14  Yes Satira Sark, MD  amoxicillin (AMOXIL) 500 MG capsule Take 500 mg by mouth 3 (three) times daily.   Yes Historical Provider, MD  aspirin 81 MG tablet Take 81 mg by mouth daily.   Yes Historical Provider, MD  atorvastatin (LIPITOR) 80 MG tablet Take 1 tablet (80 mg total) by mouth daily at 6 PM. 06/20/13  Yes Orson Eva, MD  furosemide (LASIX) 20 MG tablet Take 20 mg by mouth. Take 60 mg twice daily   Yes Historical Provider, MD  gabapentin (NEURONTIN) 300 MG capsule Take 300 mg by mouth 2 (two) times daily.    Yes Historical Provider, MD  insulin aspart (NOVOLOG) 100 UNIT/ML injection Inject 10 Units into the  skin daily.    Yes Historical Provider, MD  insulin glargine (LANTUS) 100 UNIT/ML injection Inject 60 Units into the skin daily.    Yes Historical Provider, MD  insulin glargine (LANTUS) 100 UNIT/ML injection Inject 15 Units into the skin at bedtime.   Yes Historical Provider, MD  ipratropium-albuterol (DUONEB) 0.5-2.5 (3) MG/3ML SOLN Take 3 mLs by nebulization 2 (two) times daily.    Yes Historical Provider, MD  isosorbide mononitrate (IMDUR) 30 MG  24 hr tablet Take 1 tablet (30 mg total) by mouth daily. 06/20/13  Yes Orson Eva, MD  loratadine (CLARITIN) 10 MG tablet Take 10 mg by mouth daily.   Yes Historical Provider, MD  Multiple Vitamin (MULTIVITAMIN) tablet Take 1 tablet by mouth daily.   Yes Historical Provider, MD  polyethylene glycol (MIRALAX / GLYCOLAX) packet Take 17 g by mouth daily.   Yes Historical Provider, MD  psyllium (HYDROCIL/METAMUCIL) 95 % PACK Take 1 packet by mouth 2 (two) times daily.   Yes Historical Provider, MD  torsemide (DEMADEX) 20 MG tablet Take 60 mg by mouth daily.   Yes Historical Provider, MD  traMADol (ULTRAM) 50 MG tablet Take 50 mg by mouth 3 (three) times daily as needed.   Yes Historical Provider, MD  cyanocobalamin (,VITAMIN B-12,) 1000 MCG/ML injection Inject 1,000 mcg into the skin every 30 (thirty) days.  05/06/12   Historical Provider, MD  nitroGLYCERIN (NITROSTAT) 0.4 MG SL tablet Place 0.4 mg under the tongue every 5 (five) minutes as needed for chest pain.    Historical Provider, MD    Current Facility-Administered Medications  Medication Dose Route Frequency Provider Last Rate Last Dose  . 0.9 %  sodium chloride infusion  250 mL Intravenous PRN Tanna Savoy Stinson, DO      . ALPRAZolam Duanne Moron) tablet 0.25 mg  0.25 mg Oral BID PRN Tanna Savoy Stinson, DO   0.25 mg at 02/01/14 2235  . amoxicillin (AMOXIL) capsule 500 mg  500 mg Oral TID Truett Mainland, DO   500 mg at 02/02/14 2223  . aspirin chewable tablet 81 mg  81 mg Oral Daily Truett Mainland, DO   81 mg at 02/02/14 1212  . atorvastatin (LIPITOR) tablet 80 mg  80 mg Oral q1800 Tanna Savoy Stinson, DO   80 mg at 02/02/14 1832  . furosemide (LASIX) injection 80 mg  80 mg Intravenous Daily Donne Hazel, MD   80 mg at 02/02/14 1213  . gabapentin (NEURONTIN) capsule 300 mg  300 mg Oral BID Tanna Savoy Stinson, DO   300 mg at 02/02/14 2223  . heparin injection 5,000 Units  5,000 Units Subcutaneous 3 times per day Donne Hazel, MD   5,000 Units at 02/03/14 [id number]   . insulin aspart (novoLOG) injection 0-15 Units  0-15 Units Subcutaneous TID WC Tanna Savoy Stinson, DO   0 Units at 02/01/14 0800  . insulin glargine (LANTUS) injection 60 Units  60 Units Subcutaneous Daily Tanna Savoy Stinson, DO   60 Units at 02/02/14 1213  . isosorbide mononitrate (IMDUR) 24 hr tablet 30 mg  30 mg Oral Daily Tanna Savoy Stinson, DO   30 mg at 02/02/14 1212  . nitroGLYCERIN (NITROSTAT) SL tablet 0.4 mg  0.4 mg Sublingual Q5 min PRN Tanna Savoy Stinson, DO      . nystatin cream (MYCOSTATIN)   Topical BID Donne Hazel, MD      . ondansetron [facility]) injection 4 mg  4 mg Intravenous Q6H PRN Truett Mainland,  DO      . psyllium (HYDROCIL/METAMUCIL) packet 1 packet  1 packet Oral BID Truett Mainland, DO   1 packet at 02/02/14 2200  . sodium chloride 0.9 % injection 3 mL  3 mL Intravenous Q12H Tanna Savoy Stinson, DO   3 mL at 02/02/14 2200  . sodium chloride 0.9 % injection 3 mL  3 mL Intravenous PRN Tanna Savoy Stinson, DO      . traMADol Veatrice Bourbon) tablet 50 mg  50 mg Oral TID PRN Tanna Savoy Stinson, DO   50 mg at 02/01/14 2235    Allergies as of 01/08/2014  . (No Known Allergies)    Family History  Problem Relation Age of Onset  . Hypertension Father   . Colon cancer Neg Hx     History   Social History  . Marital Status: Single    Spouse Name: N/A    Number of Children: N/A  . Years of Education: N/A   Occupational History  . Not on file.   Social History Main Topics  . Smoking status: Former Smoker -- 3.00 packs/day for 39 years    Types: Cigarettes    Start date: 10/31/1947    Quit date: 03/07/1987  . Smokeless tobacco: Former Systems developer    Types: Chew  . Alcohol Use: No     Comment: denies ETOH abuse, social drinker  . Drug Use: No  . Sexual Activity: Not on file   Other Topics Concern  . Not on file   Social History Narrative    Review of Systems: As mentioned in HPI.   Physical Exam: Vital signs in last 24 hours: Temp:  [97.9 F (36.6 C)-98.2 F (36.8 C)] 98.2 F  (36.8 C) (12/01 0442) Pulse Rate:  [57-66] 66 (12/01 0442) Resp:  [20] 20 (12/01 0442) BP: (96-134)/(43-62) 115/46 mmHg (12/01 0442) SpO2:  [99 %-100 %] 99 % (12/01 0442) Weight:  [263 lb 3.7 oz (119.4 kg)] 263 lb 3.7 oz (119.4 kg) (12/01 0434) Last BM Date: 01/30/14 General:   Alert, appears chronically ill.  Head:  Normocephalic and atraumatic. Eyes:  Sclera clear, no icterus.    Ears:  Hard of hearing  Lungs:  Scattered rhonchi, decreased breath sounds at bases Heart:  S1 S2 present, soft systolic murmur Abdomen:  +BS, obese, anasarca. Difficult to appreciate HSM due to large body habitus. Non-tense.  Rectal:  Deferred  Extremities:  3+ lower extremity pitting edema to thigh Neurologic:  Alert and  oriented x4 Skin:  fragile Psych:  Alert and cooperative. Normal mood and affect.  Intake/Output from previous day: 11/30 0701 - 12/01 0700 In: -  Out: 1000 [Urine:1000] Intake/Output this shift:    Lab Results:  Recent Labs  01/08/2014 1551 02/02/14 0308 02/03/14 0558  WBC 9.4 11.3* 8.5  HGB 11.5* 11.8* 10.8*  HCT 38.2* 39.0 36.1*  PLT 341 339 296   BMET  Recent Labs  02/01/14 0725 02/02/14 0308 02/03/14 0558  NA 139 137  137 141  K 5.7* 5.9*  6.0* 5.1  CL 99 97  97 100  CO2 _0 GLUCOSE 85 120*  123* 51*  BUN 42* 54*  55* 64*  CREATININE 2.49* 2.90*  2.91* 2.80*  CALCIUM 8.8 8.9  9.0 8.8   LFT  Recent Labs  02/02/14 0308 02/03/14 0558  PROT 7.2 6.6  ALBUMIN 2.5* 2.2*  AST 1022* 479*  ALT 554* 409*  ALKPHOS 266* 232*  BILITOT 1.1 1.1  PT/INR  Recent Labs  02/03/14 0558  LABPROT 17.8*  INR 1.45   Hepatitis Panel  Recent Labs  02/02/14 1830  HEPBSAG NEGATIVE  HCVAB NEGATIVE  HEPAIGM NON REACTIVE  HEPBIGM NON REACTIVE    Studies/Results: Dg Chest 1 View  02/02/2014   CLINICAL DATA:  RIGHT pleural effusion post thoracentesis  EXAM: CHEST - 1 VIEW  COMPARISON:  02/01/2014  FINDINGS: Markedly decreased RIGHT pleural  effusion and basilar atelectasis post thoracentesis.  No pneumothorax.  Enlargement of cardiac silhouette with pulmonary vascular congestion.  Improved pulmonary edema.  Persistent small LEFT pleural effusion and basilar atelectasis.  No acute osseous findings.  IMPRESSION: Improved CHF with persistent LEFT basilar effusion and atelectasis.  Decreased RIGHT pleural effusion and basilar atelectasis post thoracentesis without pneumothorax.   Electronically Signed   By: Lavonia Dana M.D.   On: 02/02/2014 16:38   Ct Chest Wo Contrast  02/01/2014   CLINICAL DATA:  Pleural effusion, shortness of breath  EXAM: CT CHEST WITHOUT CONTRAST  TECHNIQUE: Multidetector CT imaging of the chest was performed following the standard protocol without IV contrast.  COMPARISON:  Chest radiographs dated 02/01/2014 at 1139 hr  FINDINGS: Motion degraded images.  Scattered mild patchy ground-glass opacities in the upper lobes, likely amount interstitial edema.  Moderate right and small to moderate left pleural effusions.  Associated bilateral lower lobe opacities, likely compressive atelectasis.  No pneumothorax.  Cardiomegaly. Trace pericardial fluid. Coronary atherosclerosis. Atherosclerotic calcifications of the aortic arch.  Small mediastinal lymph nodes.  Visualized upper abdomen is notable for a mildly nodular hepatic contour and upper abdominal ascites.  Degenerative changes of the visualized thoracolumbar spine.  IMPRESSION: Cardiomegaly with mild interstitial edema.  Moderate right and small to moderate left pleural effusions.  Associated bilateral lower lobe opacities, likely compressive atelectasis.   Electronically Signed   By: Julian Hy M.D.   On: 02/01/2014 15:16   US Abdomen Complete  02/02/2014   CLINICAL DATA:  80 year old male with abdominal pain, elevated LFTs and chronic abdominal distention  EXAM: ULTRASOUND ABDOMEN COMPLETE  COMPARISON:  Prior CT abdomen/ pelvis 02/06/2013  FINDINGS: Gallbladder: Mobile  echogenic foci with posterior acoustic shadowing are present within the gallbladder lumen consistent with choledocholithiasis. Per the sonographer, the sonographic Percell Miller sign was negative. There is mild thickening of the gallbladder wall which measures up to 7 mm. The gallbladder is not particularly distended.  Common bile duct: Diameter: Within normal limits at 3 mm  Liver: Dense hepatic parenchyma which is echogenic and coarsened. No discrete lesion identified. The gallbladder is crests that the liver is enlarged at nearly 20 cm in craniocaudal dimension and the contour appears slightly nodular. Overall, the findings are suspicious for cirrhosis.  IVC: No abnormality visualized.  Pancreas: Not well seen secondary to obscuring bowel gas. Visualized portion unremarkable.  Spleen: The spleen is enlarged with an estimated volume of 599 cubic cm (411 cubic cm is the upper limit of normal)  Right Kidney: Length: 11.1 cm. No hydronephrosis. Normal parenchymal echogenicity. Mild diffuse cortical thinning.  Left Kidney: Length: 11.6 cm. No hydronephrosis. Normal parenchymal echogenicity. Mild diffuse cortical thinning.  Abdominal aorta: Not seen secondary to obscuring bowel gas.  Other findings: Large right pleural effusion. Mild to moderate perihepatic ascites.  IMPRESSION: 1. Sonographic findings are suggestive of hepatic cirrhosis with portal hypertension (ascites and splenomegaly). 2. Cholelithiasis with gallbladder wall thickening. Gallbladder wall thickening is indeterminate in the setting of cirrhosis and can be related to hypoalbuminemia, right heart failure and intrinsic  liver disease as well acute or chronic cholecystitis. Sonographic Murphy sign was negative. 3. Large right pleural effusion. 4. Bilateral renal cortical thinning.   Electronically Signed   By: Jacqulynn Cadet M.D.   On: 02/02/2014 16:33   US Pelvis Limited  02/02/2014   CLINICAL DATA:  Chronic abdominal distention, abdominal pain  EXAM:  LIMITED ULTRASOUND OF PELVIS  TECHNIQUE: Limited transabdominal ultrasound examination of the pelvis was performed.  COMPARISON:  None.  FINDINGS: Real-time sonographic evaluation of the pelvis and lower abdomen demonstrates no free fluid.  IMPRESSION: No evidence of free fluid in the lower abdomen and pelvis.   Electronically Signed   By: Kathreen Devoid   On: 02/02/2014 16:35   Dg Chest Port 1 View  02/01/2014   CLINICAL DATA:  Shortness of breath, congestion, bilateral lower extremity swelling  EXAM: PORTABLE CHEST - 1 VIEW  COMPARISON:  02/02/2014  FINDINGS: Cardiomegaly with mild interstitial edema. Moderate right and small left pleural effusions. Associated bilateral lower lobe opacities, likely atelectasis.  No pneumothorax.  IMPRESSION: Cardiomegaly with mild interstitial edema.  Moderate right and small left pleural effusions.  Associated lower lobe opacities, likely atelectasis.   Electronically Signed   By: Julian Hy M.D.   On: 02/01/2014 13:54   US Thoracentesis Asp Pleural Space W/img Guide  02/02/2014   CLINICAL DATA:  BILATERAL pleural effusions greater on RIGHT, history hypertension, type 2 diabetes, coronary artery disease post MI, atrial fibrillation, ischemic cardiomyopathy, CHF  EXAM: ULTRASOUND GUIDED DIAGNOSTIC AND THERAPEUTIC THORACENTESIS  COMPARISON:  Chest CT 02/01/2014  PROCEDURE: Procedure, benefits, and risks of procedure were discussed with patient.  Written informed consent for procedure was obtained.  Time out protocol followed.  Pleural effusion localized by ultrasound at the posterior RIGHT hemithorax.  Skin prepped and draped in usual sterile fashion.  Skin and soft tissues anesthetized with 10 of 1% lidocaine.  8 French thoracentesis catheter placed into the RIGHT pleural space.  1400 mL of clear yellow fluid aspirated by syringe pump.  Procedure tolerated well by patient without immediate complication.  FINDINGS: A total of approximately 1400 mL of RIGHT pleural  fluid was removed. A fluid sample of 180 mL was sent for laboratory analysis.  IMPRESSION: Successful ultrasound guided RIGHT thoracentesis yielding 1400 mL of pleural fluid.   Electronically Signed   By: Lavonia Dana M.D.   On: 02/02/2014 16:37    Impression: 80 year old male with multiple comorbidities, admitted with CHF exacerbation, acute on chronic renal failure, demand ischemia. Found to have cirrhosis via ultrasound findings. MELD 21. Unclear etiology but could be secondary to fatty liver; viral markers negative. New finding of  elevated transaminases and alk phos noted with some improvement since admission; query ischemic hepatitis, hepatic congestion, unable to exclude drug effect, less likely autoimmune/PBC. Previous LFTs in April 2015 normal.  Imaging in 2012 via CT with concern for cirrhosis. LFTs with some improvement since admission and would continue to follow to baseline. Generalized anasarca without evidence for tense ascites; however, diuretic therapy somewhat limited due to acute on chronic renal failure. May benefit from runs of Albumin IV with Lasix, although this would only be a temporary solution. With multiple medical issues, he has a poor overall prognosis.   Also noted new, vague esophageal dysphagia. No PPI as outpatient or currently while inpatient. Will add PPI now and consider EGD with dilation while inpatient for therapeutic purposes and diagnostic to evaluate for evidence of esophageal varices. Would need to be stable from a cardiac/respiratory  standpoint before pursuing.   Plan: Add PPI Check autoimmune/PBC serologies for completeness' sake Repeat HFP in am Consider runs of albumin followed by lasix Consider EGD with dilation if stable from cardiac/respiratory standpoint Agree with sodium and fluid restriction Soft mechanical diet with no added salt Agree with discontinuing statin: briefly discussed with Dr. Lesia Hausen, ANP-BC [facility]         LOS: 3 days    02/03/2014, 10:41 AM

## 2014-02-03 DEATH — deceased

## 2014-02-04 LAB — GLUCOSE, CAPILLARY: Glucose-Capillary: 90 mg/dL (ref 70–99)

## 2014-02-04 LAB — ANA: Anti Nuclear Antibody(ANA): NEGATIVE

## 2014-02-05 LAB — MITOCHONDRIAL ANTIBODIES: Mitochondrial M2 Ab, IgG: 0.32 (ref <0.91)

## 2014-02-05 LAB — ANTI-SMOOTH MUSCLE ANTIBODY, IGG: F-Actin IgG: 58 U — ABNORMAL HIGH (ref <20)

## 2014-02-06 LAB — BODY FLUID CULTURE
Culture: NO GROWTH
Gram Stain: NONE SEEN

## 2014-02-07 LAB — ANAEROBIC CULTURE: Gram Stain: NONE SEEN

## 2014-02-19 NOTE — Discharge Summary (Signed)
Death Summary  Richard BooneSamuel L Flamm UJW:[id number] DOB: Jan 06, 1931 DOA: 01/18/2014  PCP: [name],ASHISH, MD  Admit date: 01/05/2014 Date of Death: 02/19/2014  Final Diagnoses:  Active Problems:   Congestive heart failure   Demand ischemia   Acute on chronic renal failure   Congestive heart disease   Cirrhosis   History of present illness:  [name] is a 80 y.o. male  With history of diabetes, hypertension, systolic heart failure with a LVEF of 30-35% on echocardiogram obtained April 2015. Patient also has stage III chronic kidney disease, coronary artery disease, aortic stenosis. He is a resident at [facility]. He presents to the hospital due to swelling in the legs with worsening dyspnea on exertion with a 25 pound weight gain since late October. The provider at the resident home has been increasing his furosemide and had written to change him to torsemide tomorrow, however the patient continued to be short of breath and was sent to the hospital. Here, the patient is less concerned about his breathing and is focused on the significant edema in his legs bilaterally. He does have a cough with some mild white sputum production. He denies chest pain, palpitations, dizziness, lightheadedness, abdominal pain  Hospital Course:  1. Acutely decompensated systolic CHF exacerbation 1. Worsening pleural effusion on follow up on presenting CXR 2. Pt noted to be increasingly dyspneic 3. ABG was unremarkable 4. Had been cont on min O2 support 5. Increased lasix to 80mg  6. Pt however remains with decreased urine output and concentrated appearing urine 7. Appreciated input from Cardiology 8. Overall, this is a very unfortunate situation with poor prognosis 2. Demand ischemia 1. Elevated trop on admit that had been trending down serially 2. Pt denied chest pain or nausea 3. Did have sob, but suspect is from volume overload from CHF vs ascites (see above and below) 3. Acute on CKD 1. Cr remained  stable 2. Increased lasix secondary to sob (see above) 3. Remained on 80mg  IV lasix 4. DM 1. On lantus 60 units daily with 15 units qhs 2. On SSI coverage 5. Ischemic cardiomyopathy 1. Troponin was trending down 2. Appreciate Cardiology input 6. DVT prophylaxis 1. Heparin subQ 7. Cirrhosis 1. Markedly elevated LFT's with RUQ US findings of cirrhosis 2. Acute hepatitis panel neg 3. Ammonia levels normal 4. Consulted GI and appreciate input 5. Avoid hepatotoxic agents  The patient was found unresponsive and without pulse or spontaneous breathing on the early morning of 02/14/2014   Time of death: 0242  Signed:  Anapaula Severt, Scheryl MartenSTEPHEN K  [facility] 02/19/2014, 10:45 AM

## 2014-03-06 NOTE — Progress Notes (Signed)
Patient found unresponsive. Upon assessment absence of respiratory effort and apical pulse noted. Verified with second RN K. Maisie Fushomas

## 2014-03-06 DEATH — deceased

## 2014-03-30 ENCOUNTER — Ambulatory Visit: Payer: Medicare Other | Admitting: Cardiology

## 2015-10-08 IMAGING — CR DG CHEST 1V
1 series · 1 of 1 positions shown · non-contrast
Comparison: 02/01/2014

CLINICAL DATA: RIGHT pleural effusion post thoracentesis

EXAM:
CHEST - 1 VIEW

[view not recorded]
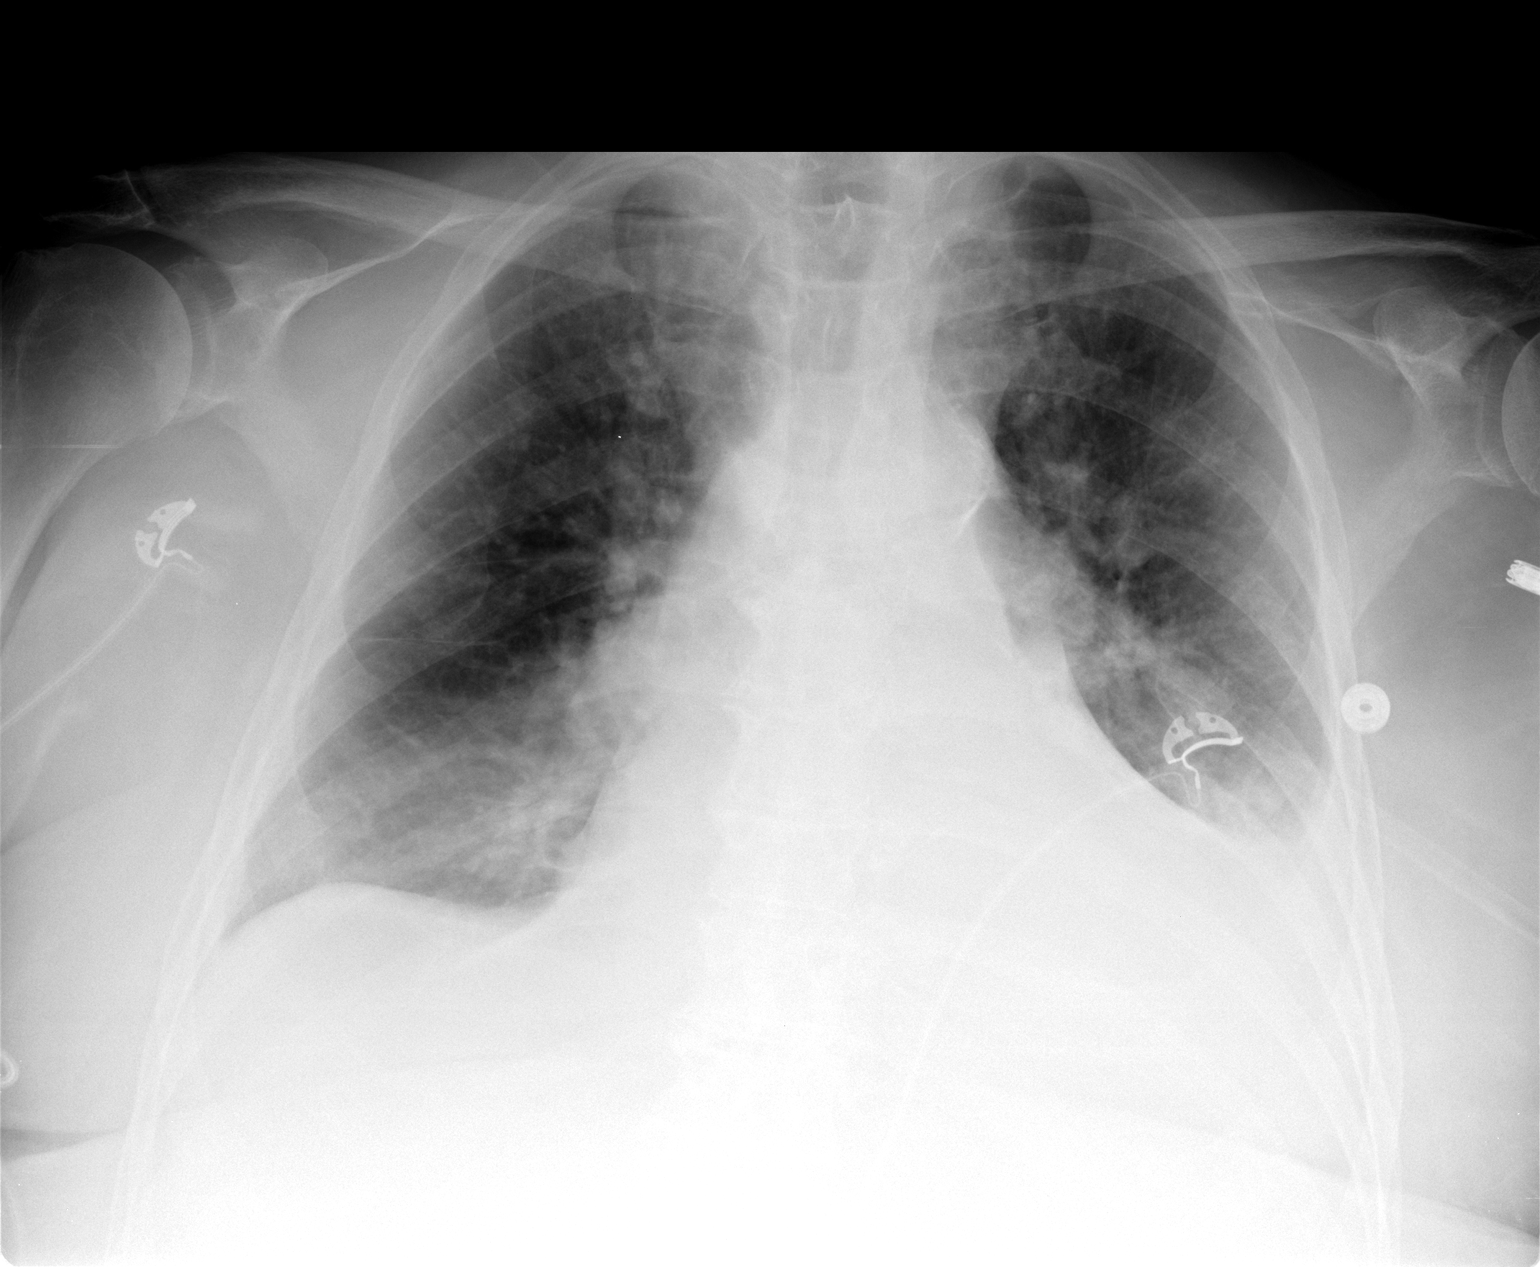

[1 of 1 positions shown; findings below may reference images not displayed]

FINDINGS: Markedly decreased RIGHT pleural effusion and basilar atelectasis
post thoracentesis.

No pneumothorax.

Enlargement of cardiac silhouette with pulmonary vascular
congestion.

Improved pulmonary edema.

Persistent small LEFT pleural effusion and basilar atelectasis.

No acute osseous findings.
IMPRESSION: Improved CHF with persistent LEFT basilar effusion and atelectasis.

Decreased RIGHT pleural effusion and basilar atelectasis post
thoracentesis without pneumothorax.

## 2015-10-08 IMAGING — US US PELVIS LIMITED
1 series · 3 of 3 positions shown · non-contrast
Comparison: None.

CLINICAL DATA: Chronic abdominal distention, abdominal pain

EXAM:
LIMITED ULTRASOUND OF PELVIS
TECHNIQUE: Limited transabdominal ultrasound examination of the pelvis was
performed.

[Series 1: us pelvis limited · 0.26mm/px · 3 of 3 slices shown]
[im 1/3]
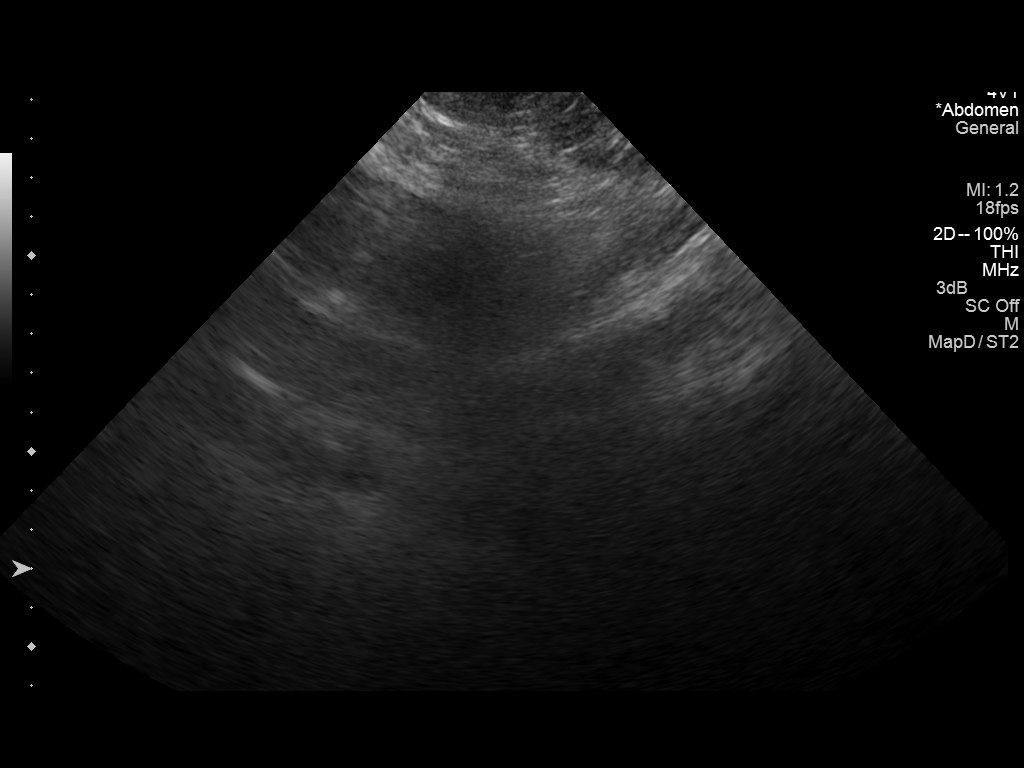
[im 2/3]
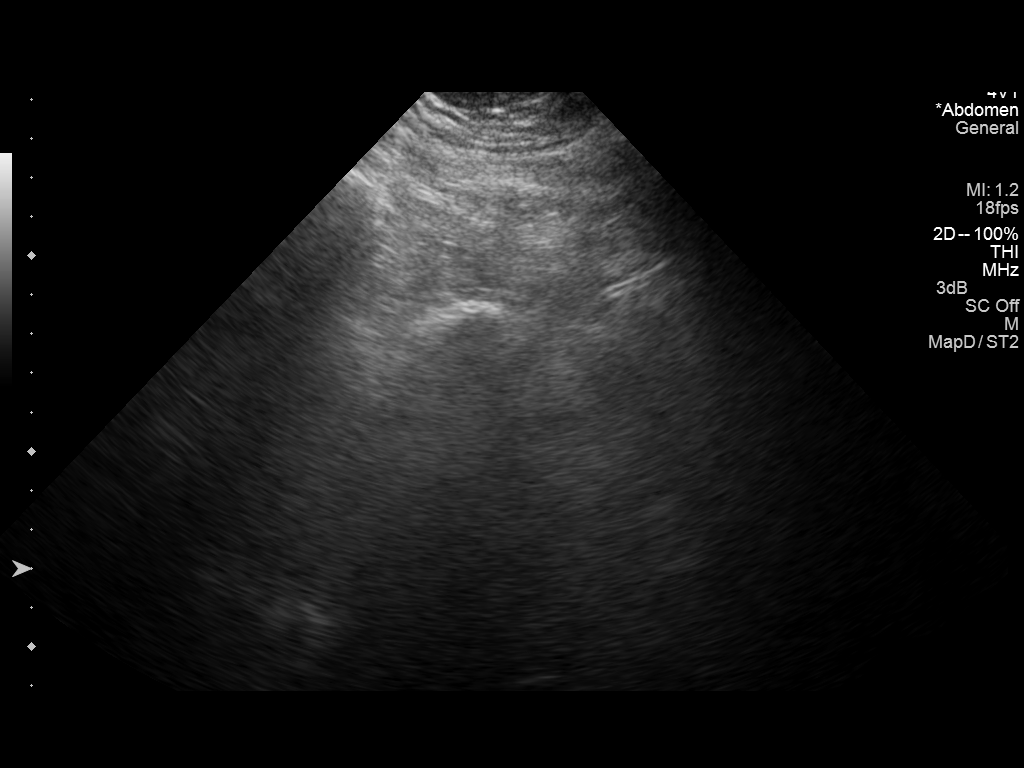
[im 3/3]
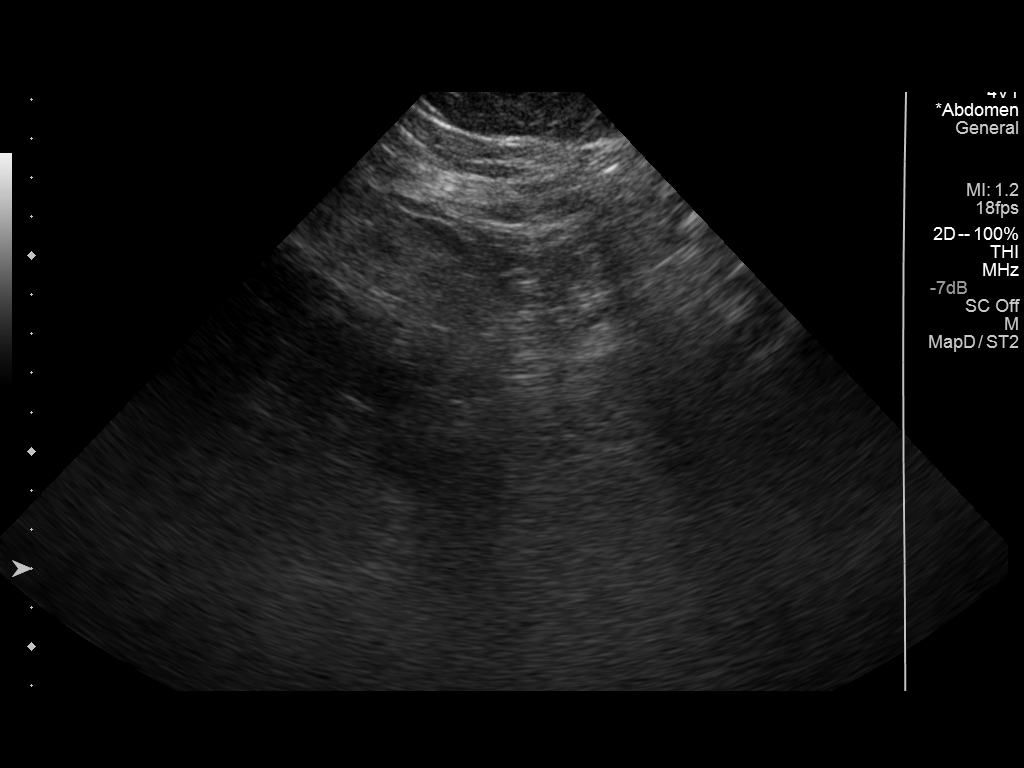

[3 of 3 positions shown; findings below may reference images not displayed]

FINDINGS: Real-time sonographic evaluation of the pelvis and lower abdomen
demonstrates no free fluid.
IMPRESSION: No evidence of free fluid in the lower abdomen and pelvis.

## 2015-10-08 IMAGING — US US ABDOMEN COMPLETE
1 series · 13 of 25 positions shown · non-contrast
Comparison: Prior CT abdomen/ pelvis 02/06/2013

CLINICAL DATA: 83-year-old male with abdominal pain, elevated LFTs
and chronic abdominal distention

EXAM:
ULTRASOUND ABDOMEN COMPLETE

[Series 1: us abdomen complete · 0.27mm/px · 13 of 73 slices shown]
[im 1/73]
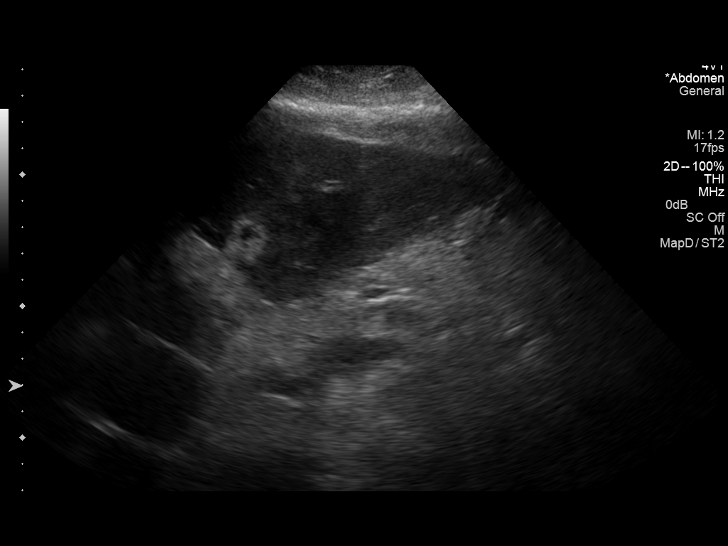
[im 7/73]
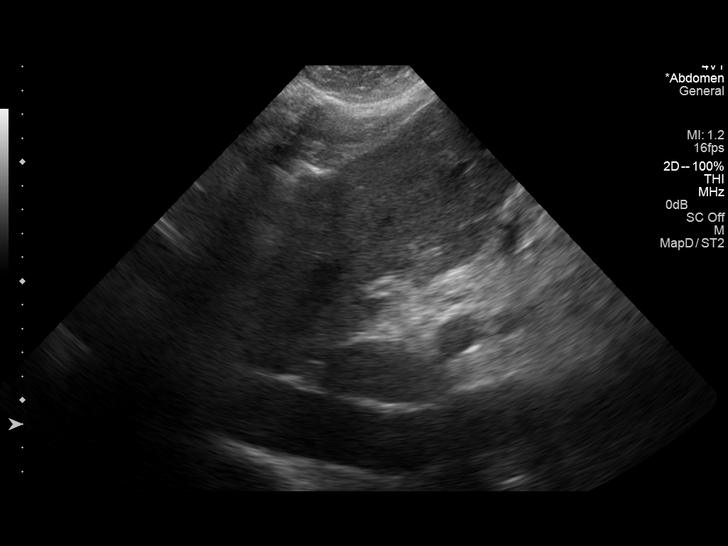
[im 13/73]
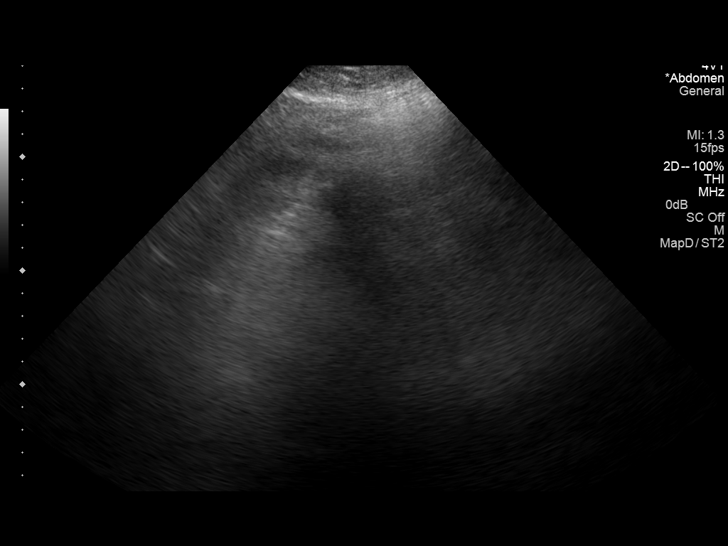
[im 19/73]
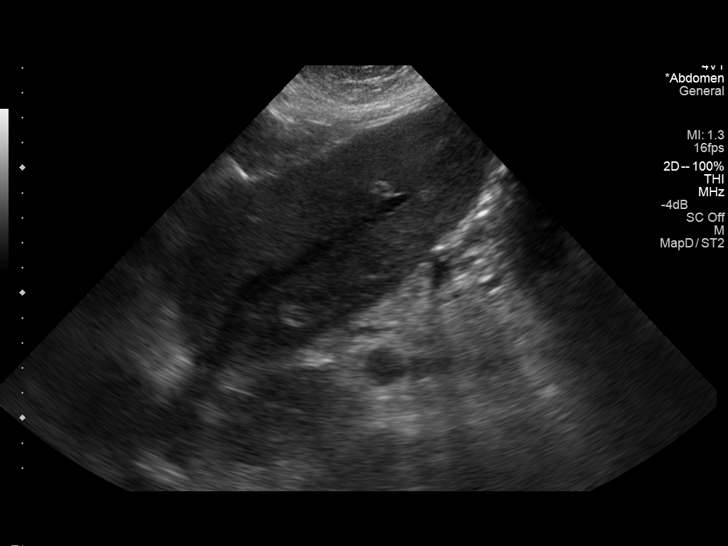
[im 25/73]
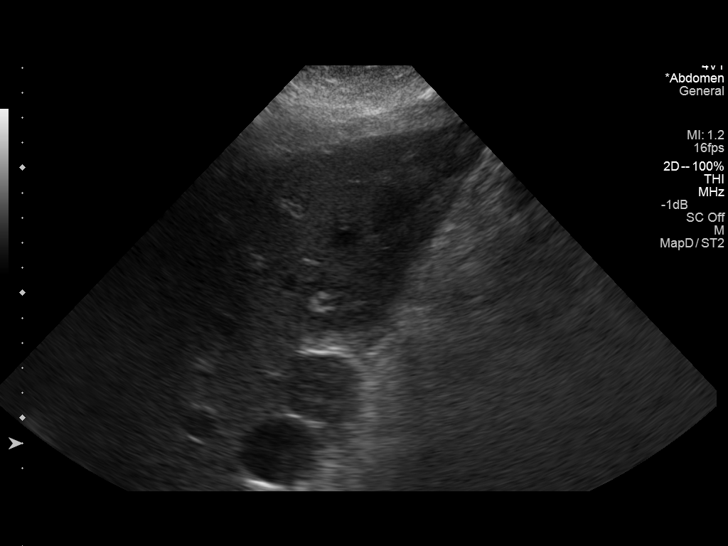
[im 31/73]
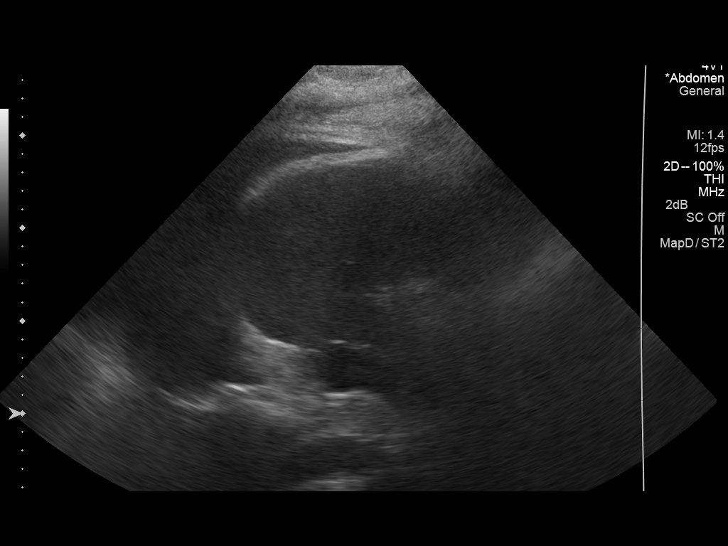
[im 37/73]
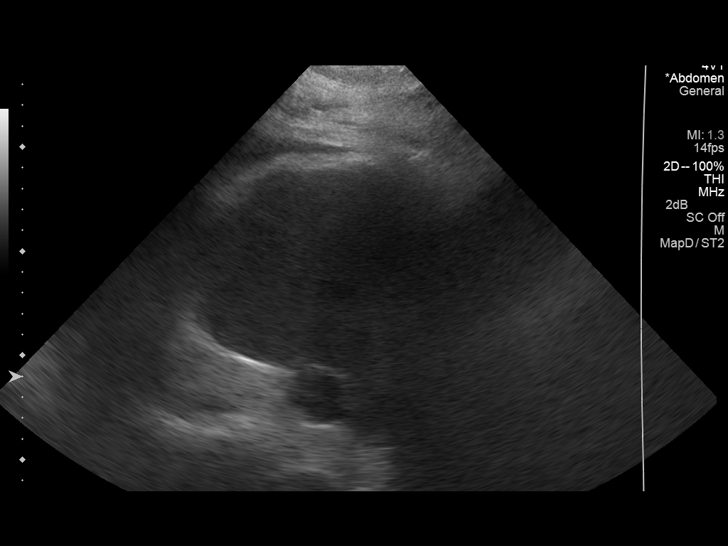
[im 43/73]
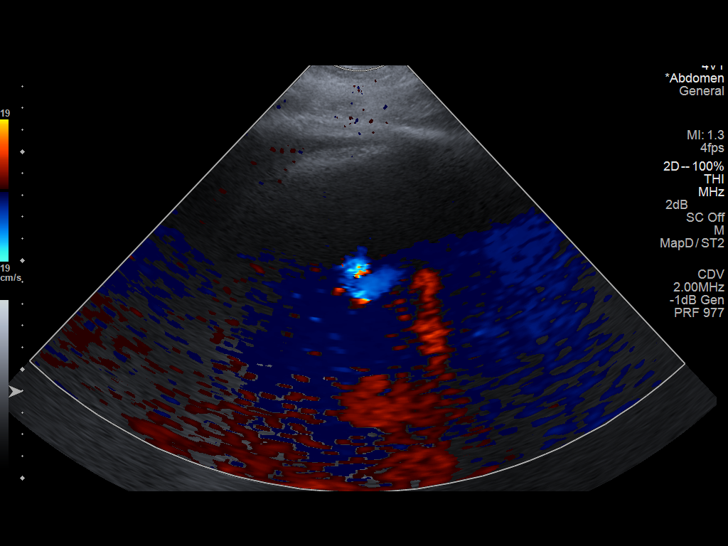
[im 49/73]
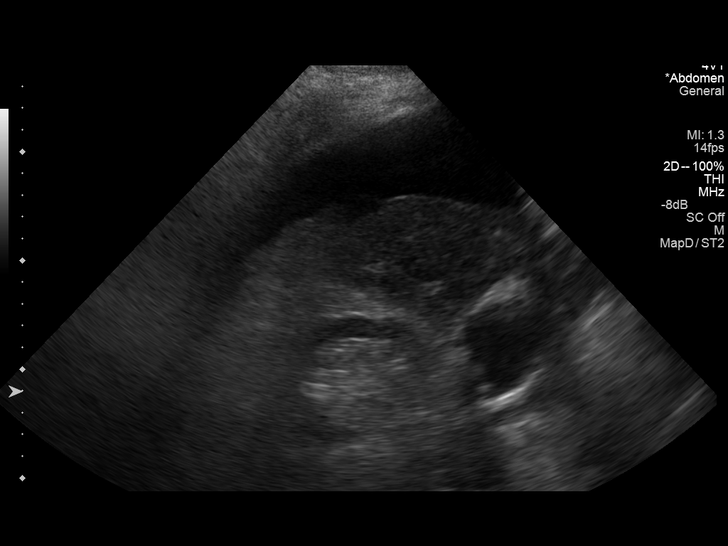
[im 55/73]
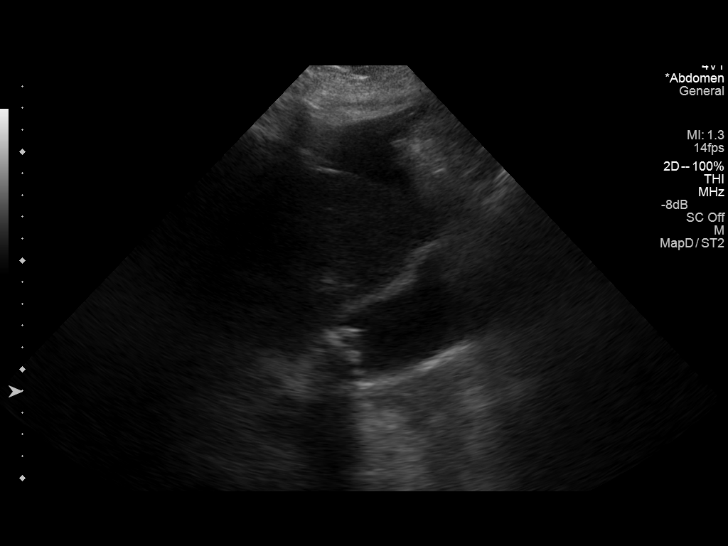
[im 61/73]
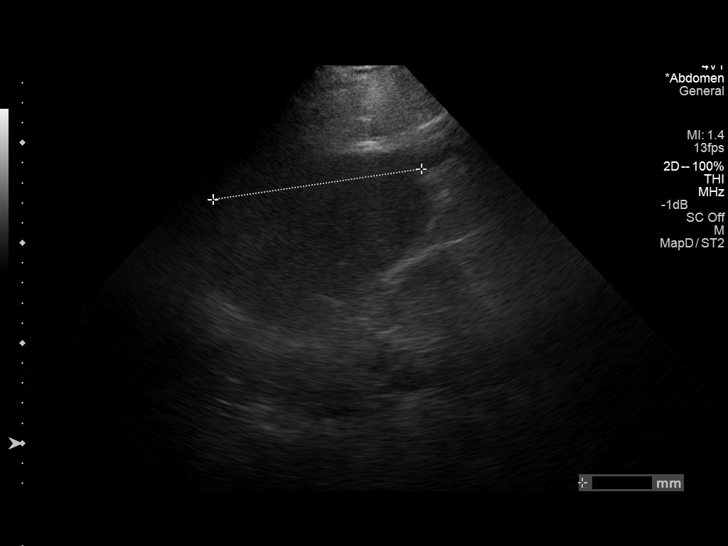
[im 67/73]
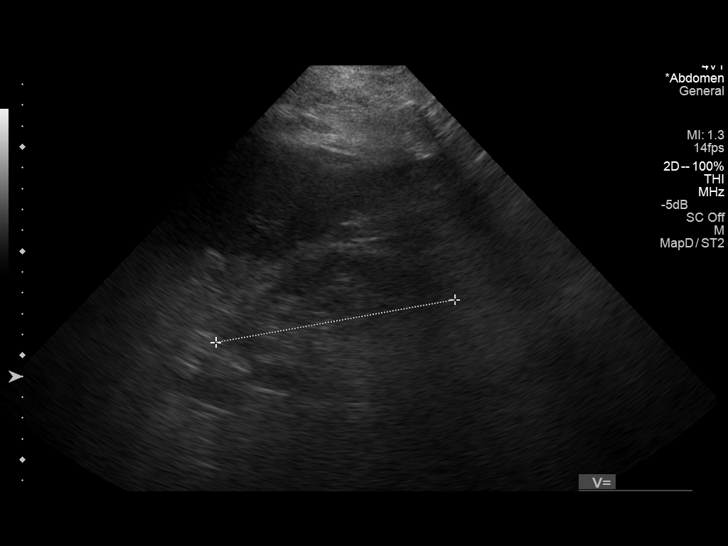
[im 73/73]
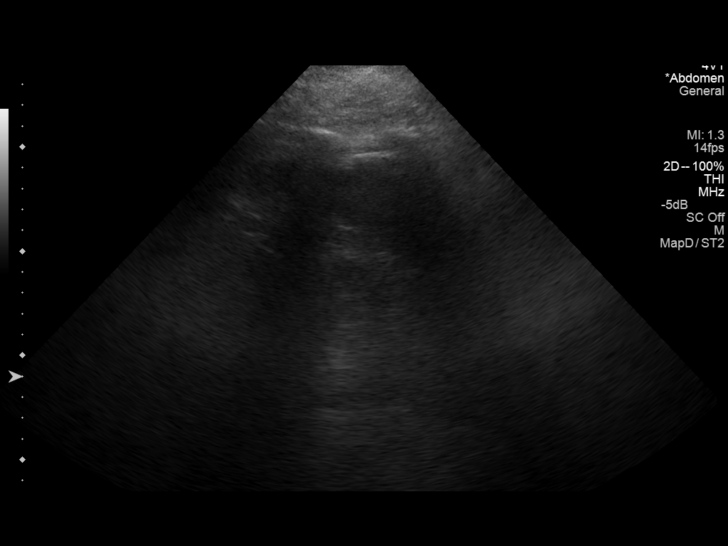

[13 of 25 positions shown; findings below may reference images not displayed]

FINDINGS: Gallbladder: Mobile echogenic foci with posterior acoustic shadowing
are present within the gallbladder lumen consistent with
choledocholithiasis. Per the sonographer, the sonographic Murphy
sign was negative. There is mild thickening of the gallbladder wall
which measures up to 7 mm. The gallbladder is not particularly
distended.

Common bile duct: Diameter: Within normal limits at 3 mm

Liver: Dense hepatic parenchyma which is echogenic and coarsened. No
discrete lesion identified. The gallbladder is crests that the liver
is enlarged at nearly 20 cm in craniocaudal dimension and the
contour appears slightly nodular. Overall, the findings are
suspicious for cirrhosis.

IVC: No abnormality visualized.

Pancreas: Not well seen secondary to obscuring bowel gas. Visualized
portion unremarkable.

Spleen: The spleen is enlarged with an estimated volume of 599 cubic
cm (411 cubic cm is the upper limit of normal)

Right Kidney: Length: 11.1 cm. No hydronephrosis. Normal parenchymal
echogenicity. Mild diffuse cortical thinning.

Left Kidney: Length: 11.6 cm. No hydronephrosis. Normal parenchymal
echogenicity. Mild diffuse cortical thinning.

Abdominal aorta: Not seen secondary to obscuring bowel gas.

Other findings: Large right pleural effusion. Mild to moderate
perihepatic ascites.
IMPRESSION: 1. Sonographic findings are suggestive of hepatic cirrhosis with
portal hypertension (ascites and splenomegaly).
2. Cholelithiasis with gallbladder wall thickening. Gallbladder wall
thickening is indeterminate in the setting of cirrhosis and can be
related to hypoalbuminemia, right heart failure and intrinsic liver
disease as well acute or chronic cholecystitis. Sonographic Murphy
sign was negative.
3. Large right pleural effusion.
4. Bilateral renal cortical thinning.
